# Patient Record
Sex: Male | Born: 1960
Health system: Southern US, Community
[De-identification: ages and names within clinical notes are randomized; demographics above are authoritative.]

## PROBLEM LIST (undated history)

## (undated) DIAGNOSIS — E785 Hyperlipidemia, unspecified: Secondary | ICD-10-CM

## (undated) DIAGNOSIS — N200 Calculus of kidney: Secondary | ICD-10-CM

## (undated) DIAGNOSIS — R001 Bradycardia, unspecified: Secondary | ICD-10-CM

## (undated) DIAGNOSIS — I10 Essential (primary) hypertension: Secondary | ICD-10-CM

## (undated) HISTORY — PX: MENISCUS REPAIR: SHX5179

## (undated) HISTORY — DX: Essential (primary) hypertension: I10

## (undated) HISTORY — PX: COLONOSCOPY: SHX174

## (undated) HISTORY — DX: Hyperlipidemia, unspecified: E78.5

## (undated) MED FILL — ATORVASTATIN CALCIUM 80MG TABS: 80 MG | 30 days supply | Qty: 30 | Fill #0 | Status: AC

## (undated) MED FILL — AMLODIPINE BESYLATE 5MG TABS: 5 MG | 30 days supply | Qty: 30 | Fill #0 | Status: AC

## (undated) MED FILL — FAMOTIDINE 20MG TABS: 20 MG | 30 days supply | Qty: 60 | Fill #0 | Status: AC

## (undated) MED FILL — ASPIRIN LOW DOSE 81MG CHEW: 81 MG | 30 days supply | Qty: 30 | Fill #0 | Status: AC

## (undated) MED FILL — PRASUGREL HCL 10MG TABS: 10 MG | 30 days supply | Qty: 30 | Fill #0 | Status: AC

## (undated) MED FILL — ISOSORBIDE MONONITRATE ER 30 TB24: 30 MG | 60 days supply | Qty: 30 | Fill #0 | Status: AC

## (undated) MED FILL — NITROGLYCERIN 0.4MG SUBL (25CT): 0.4 MG | 30 days supply | Qty: 25 | Fill #0 | Status: AC

---

## 2003-03-08 HISTORY — PX: APPENDECTOMY: SHX54

## 2004-11-24 ENCOUNTER — Encounter: Admission: RE | Admit: 2004-11-24 | Discharge: 2004-11-24 | Payer: Self-pay | Admitting: Internal Medicine

## 2006-10-08 IMAGING — CR DG CHEST SPECIAL VIEW
1 series · 1 of 1 positions shown · non-contrast
Comparison: None.

CLINICAL DATA: Shortness of breath for six weeks with cough.
 CHEST - 2 VIEW ? 11/24/04: 
 CHEST ? SINGLE VIEW ? 11/24/04:

[view not recorded]
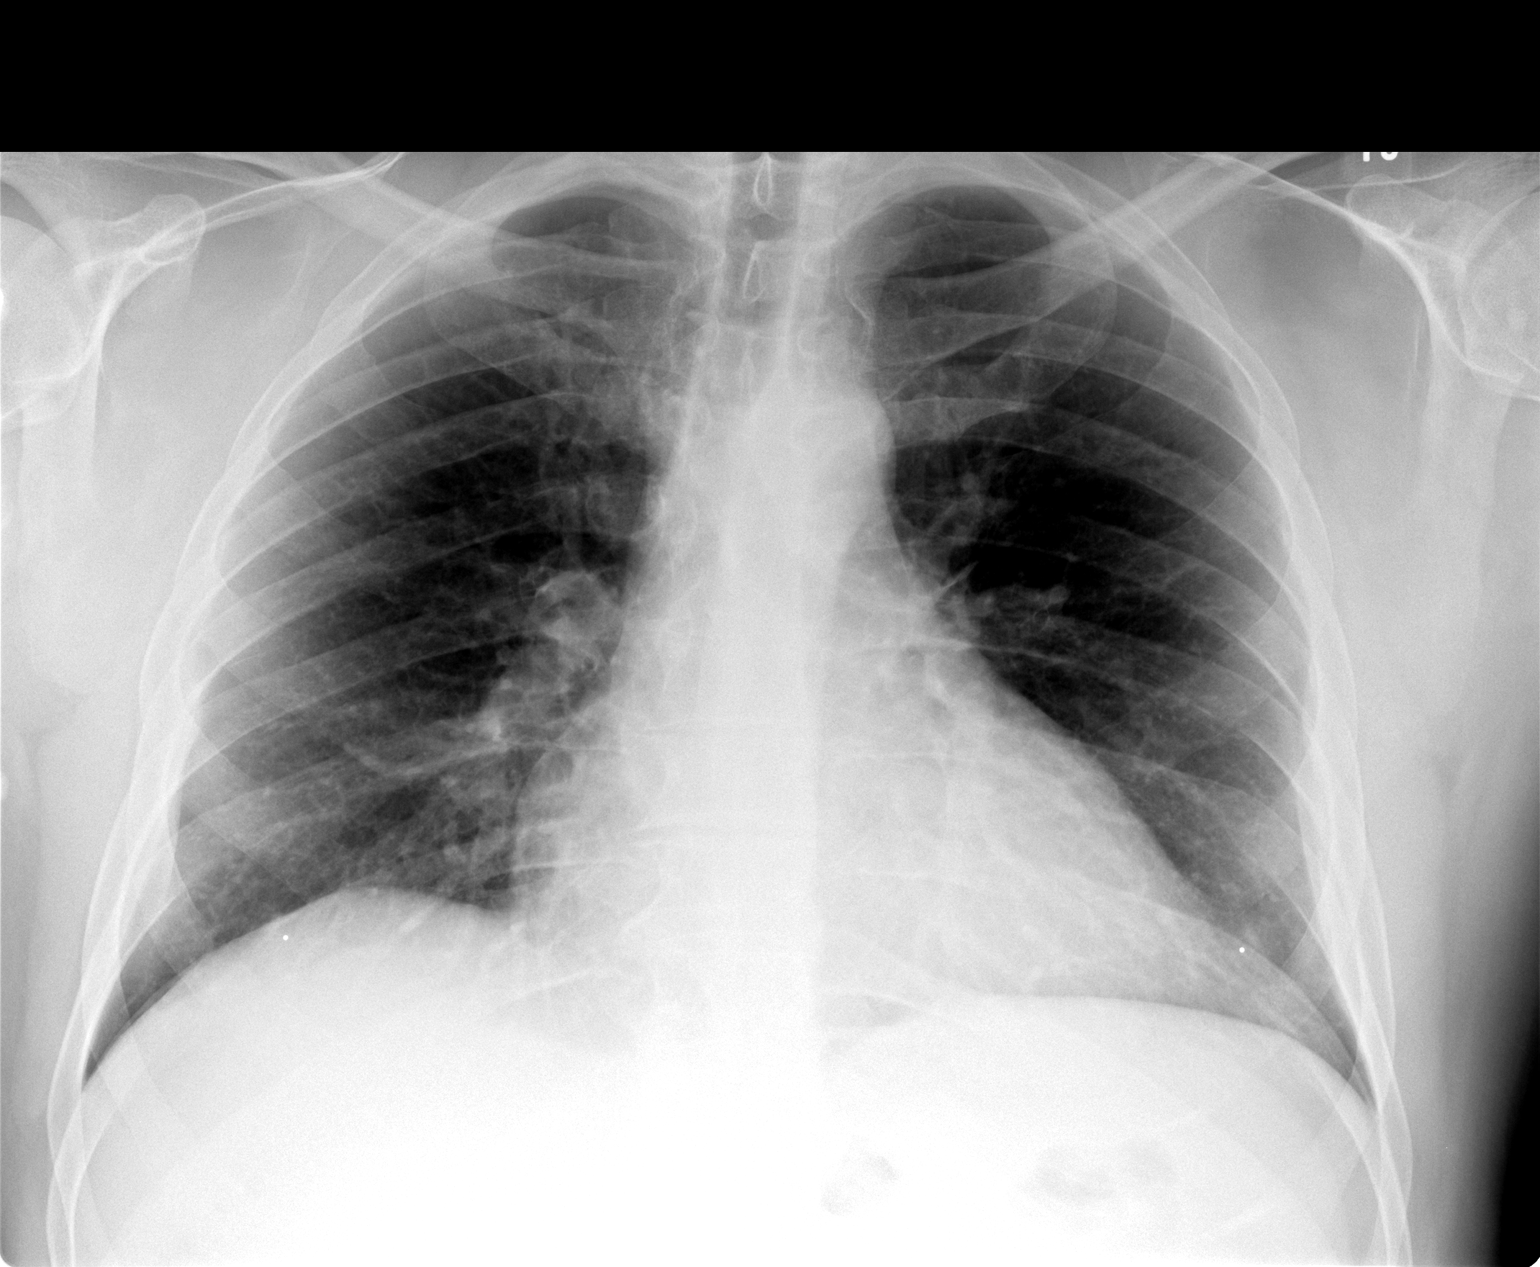

[1 of 1 positions shown; findings below may reference images not displayed]

FINDINGS: Frontal views of the chest with and without nipple markers show the heart size to be upper limits of normal.  The lungs are clear.   Extrapleural fat is seen in the lateral hemithoraces bilaterally.  No pleural fluid.
IMPRESSION: 1.  No acute cardiopulmonary process.
 2.  Heart size upper limits of normal.

## 2006-10-08 IMAGING — CR DG CHEST 2V
2 series · 2 of 2 positions shown · non-contrast
Comparison: None.

CLINICAL DATA: Shortness of breath for six weeks with cough.
 CHEST - 2 VIEW ? 11/24/04: 
 CHEST ? SINGLE VIEW ? 11/24/04:

[view not recorded (1 of 2)]
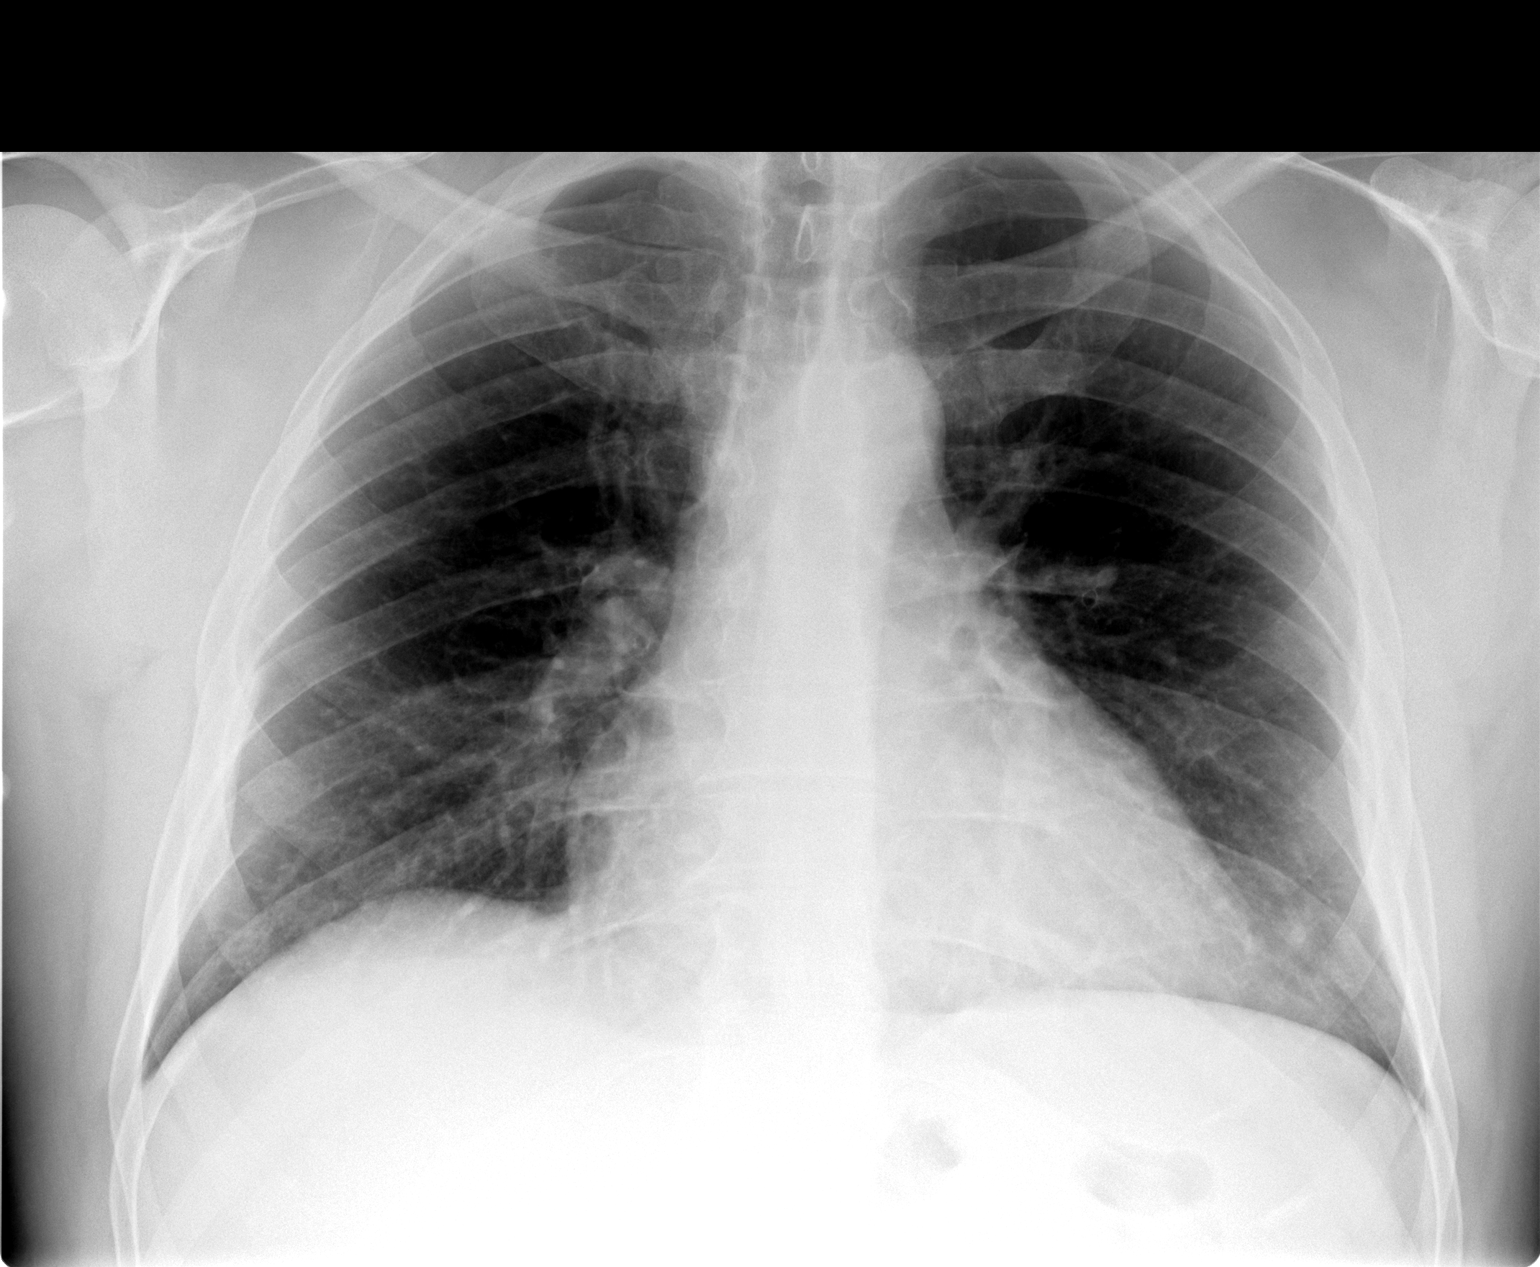

[view not recorded (2 of 2)]
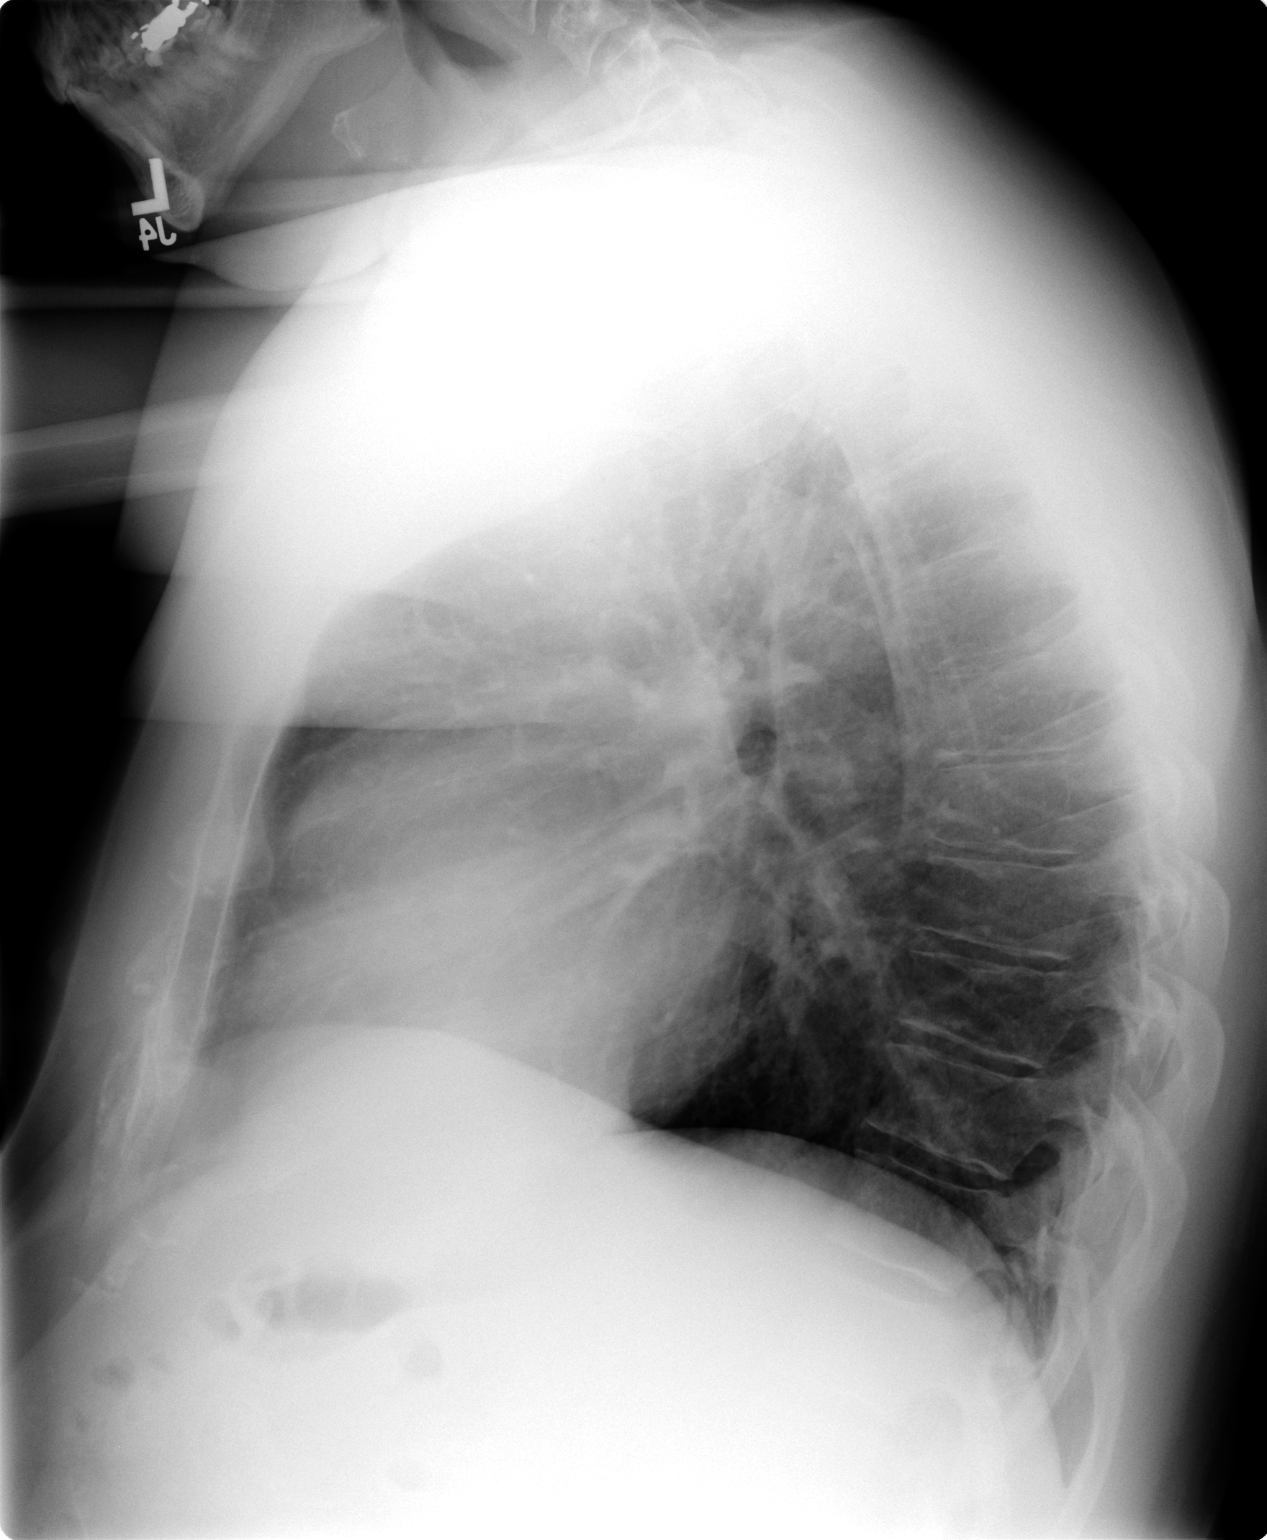

[2 of 2 positions shown; findings below may reference images not displayed]

FINDINGS: Frontal views of the chest with and without nipple markers show the heart size to be upper limits of normal.  The lungs are clear.   Extrapleural fat is seen in the lateral hemithoraces bilaterally.  No pleural fluid.
IMPRESSION: 1.  No acute cardiopulmonary process.
 2.  Heart size upper limits of normal.

## 2011-08-15 ENCOUNTER — Encounter: Payer: Self-pay | Admitting: Gastroenterology

## 2011-09-20 ENCOUNTER — Encounter: Payer: Self-pay | Admitting: Gastroenterology

## 2011-09-20 ENCOUNTER — Ambulatory Visit (AMBULATORY_SURGERY_CENTER): Payer: BC Managed Care – PPO | Admitting: *Deleted

## 2011-09-20 VITALS — Ht 74.0 in | Wt 240.0 lb

## 2011-09-20 DIAGNOSIS — Z1211 Encounter for screening for malignant neoplasm of colon: Secondary | ICD-10-CM

## 2011-09-20 MED ORDER — MOVIPREP 100 G PO SOLR: ORAL | Status: DC

## 2011-10-04 ENCOUNTER — Encounter: Payer: Self-pay | Admitting: Gastroenterology

## 2011-10-04 ENCOUNTER — Ambulatory Visit (AMBULATORY_SURGERY_CENTER): Payer: BC Managed Care – PPO | Admitting: Gastroenterology

## 2011-10-04 VITALS — BP 142/86 | HR 42 | Temp 96.4°F | Resp 19 | Ht 74.0 in | Wt 240.0 lb

## 2011-10-04 DIAGNOSIS — Z1211 Encounter for screening for malignant neoplasm of colon: Secondary | ICD-10-CM

## 2011-10-04 DIAGNOSIS — D126 Benign neoplasm of colon, unspecified: Secondary | ICD-10-CM

## 2011-10-04 MED ORDER — SODIUM CHLORIDE 0.9 % IV SOLN: 500.0000 mL | INTRAVENOUS | Status: DC

## 2011-10-04 NOTE — Patient Instructions (Addendum)
Discharge instructions given with verbal understanding. Handout on polyps given. Resume previous medications. YOU HAD AN ENDOSCOPIC PROCEDURE TODAY AT THE [facility]: Refer to the procedure report that was given to you for any specific questions about what was found during the examination.  If the procedure report does not answer your questions, please call your gastroenterologist to clarify.  If you requested that your care partner not be given the details of your procedure findings, then the procedure report has been included in a sealed envelope for you to review at your convenience later.  YOU SHOULD EXPECT: Some feelings of bloating in the abdomen. Passage of more gas than usual.  Walking can help get rid of the air that was put into your GI tract during the procedure and reduce the bloating. If you had a lower endoscopy (such as a colonoscopy or flexible sigmoidoscopy) you may notice spotting of blood in your stool or on the toilet paper. If you underwent a bowel prep for your procedure, then you may not have a normal bowel movement for a few days.  DIET: Your first meal following the procedure should be a light meal and then it is ok to progress to your normal diet.  A half-sandwich or bowl of soup is an example of a good first meal.  Heavy or fried foods are harder to digest and may make you feel nauseous or bloated.  Likewise meals heavy in dairy and vegetables can cause extra gas to form and this can also increase the bloating.  Drink plenty of fluids but you should avoid alcoholic beverages for 24 hours.  ACTIVITY: Your care partner should take you home directly after the procedure.  You should plan to take it easy, moving slowly for the rest of the day.  You can resume normal activity the day after the procedure however you should NOT DRIVE or use heavy machinery for 24 hours (because of the sedation medicines used during the test).    SYMPTOMS TO REPORT IMMEDIATELY: A  gastroenterologist can be reached at any hour.  During normal business hours, 8:30 AM to 5:00 PM Monday through Friday, call [phone].  After hours and on weekends, please call the GI answering service at [phone] who will take a message and have the physician on call contact you.   Following lower endoscopy (colonoscopy or flexible sigmoidoscopy):  Excessive amounts of blood in the stool  Significant tenderness or worsening of abdominal pains  Swelling of the abdomen that is new, acute  Fever of 100F or higher  FOLLOW UP: If any biopsies were taken you will be contacted by phone or by letter within the next 1-3 weeks.  Call your gastroenterologist if you have not heard about the biopsies in 3 weeks.  Our staff will call the home number listed on your records the next business day following your procedure to check on you and address any questions or concerns that you may have at that time regarding the information given to you following your procedure. This is a courtesy call and so if there is no answer at the home number and we have not heard from you through the emergency physician on call, we will assume that you have returned to your regular daily activities without incident.  SIGNATURES/CONFIDENTIALITY: You and/or your care partner have signed paperwork which will be entered into your electronic medical record.  These signatures attest to the fact that that the information above on your After Visit Summary has   been reviewed and is understood.  Full responsibility of the confidentiality of this discharge information lies with you and/or your care-partner. 

## 2011-10-04 NOTE — Progress Notes (Signed)
The pt told me pre-procedure that his heart rate ran low running 44.  Kay advised Dr. Arlyce Dice of this pre-sedation. Maw  The pt tolerated the colonoscopy very well. Maw

## 2011-10-04 NOTE — Op Note (Signed)
[facility] [address] [place], [place]  [address]  COLONOSCOPY PROCEDURE REPORT  PATIENT:  Brandon Short, Brandon Short  MR#:  [id number] BIRTHDATE:  1960/12/27, 50 yrs. old  GENDER:  male ENDOSCOPIST:  Barbette Hair. Arlyce Dice, MD REF. BY:  Nila Nephew, M.D. PROCEDURE DATE:  10/04/2011 PROCEDURE:  Colonoscopy with snare polypectomy, Colon with cold biopsy polypectomy ASA CLASS:  Class I INDICATIONS:  Routine Risk Screening MEDICATIONS:   MAC sedation, administered by CRNA propofol 350mg IV  DESCRIPTION OF PROCEDURE:   After the risks benefits and alternatives of the procedure were thoroughly explained, informed consent was obtained.  Digital rectal exam was performed and revealed no abnormalities.   The LB [id number]-[id number] endoscope was introduced through the anus and advanced to the cecum, which was identified by both the appendix and ileocecal valve, without limitations.  The quality of the prep was excellent, using MoviPrep.  The instrument was then slowly withdrawn as the colon was fully examined. <<PROCEDUREIMAGES>>  FINDINGS:  Three polyps were found in the sigmoid colon. Single 2mm and 2 2-41mm sessile polyps between 18 and 20cm from anus, removed via cold bx forceps (2mm polyp) and cold polypectomy snare, respectively (see image2 and image3).  This was otherwise a normal examination of the colon (see image1 and image4). Retroflexed views in the rectum revealed no abnormalities.    The time to cecum =  1) 2.0  minutes. The scope was then withdrawn in 1) 10.50  minutes from the cecum and the procedure completed.COMPLICATIONS:  None ENDOSCOPIC IMPRESSION: 1) Three polyps in the sigmoid colon 2) Otherwise normal examination RECOMMENDATIONS: 1) If the polyp(s) removed today are proven to be adenomatous (pre-cancerous) polyps, you will need a repeat colonoscopy in 5 years. Otherwise you should continue to follow colorectal cancer screening guidelines for "routine risk" patients  with colonoscopy in 10 years. You will receive a letter within 1-2 weeks with the results of your biopsy as well as final recommendations. Please call my office if you have not received a letter after 3 weeks. REPEAT EXAM:   You will receive a letter from Dr. Arlyce Dice in 1-2 weeks, after reviewing the final pathology, with followup recommendations.  ______________________________ Barbette Hair Arlyce Dice, MD  CC:  n. eSIGNED:   Barbette Hair. Girl Schissler at 10/04/2011 09:58 AM  Virgel Gess, [id number]

## 2011-10-04 NOTE — Progress Notes (Signed)
Patient did not experience any of the following events: a burn prior to discharge; a fall within the facility; wrong site/side/patient/procedure/implant event; or a hospital transfer or hospital admission upon discharge from the facility. (G8907) Patient did not have preoperative order for IV antibiotic SSI prophylaxis. (G8918)  

## 2011-10-05 ENCOUNTER — Telehealth: Payer: Self-pay | Admitting: *Deleted

## 2011-10-05 NOTE — Telephone Encounter (Signed)
  Follow up Call-  Call back number 10/04/2011  Post procedure Call Back phone  # [phone]  Permission to leave phone message Yes     Patient questions:  Do you have a fever, pain , or abdominal swelling? no Pain Score  0 *  Have you tolerated food without any problems? yes  Have you been able to return to your normal activities? yes  Do you have any questions about your discharge instructions: Diet   no Medications  no Follow up visit  no  Do you have questions or concerns about your Care? no  Actions: * If pain score is 4 or above: No action needed, pain <4.

## 2011-10-10 ENCOUNTER — Encounter: Payer: Self-pay | Admitting: Gastroenterology

## 2017-06-27 DIAGNOSIS — H2513 Age-related nuclear cataract, bilateral: Secondary | ICD-10-CM | POA: Insufficient documentation

## 2017-06-27 DIAGNOSIS — H209 Unspecified iridocyclitis: Secondary | ICD-10-CM | POA: Insufficient documentation

## 2017-06-27 DIAGNOSIS — H35411 Lattice degeneration of retina, right eye: Secondary | ICD-10-CM | POA: Insufficient documentation

## 2018-03-20 ENCOUNTER — Other Ambulatory Visit: Payer: Self-pay | Admitting: Internal Medicine

## 2018-03-20 DIAGNOSIS — R112 Nausea with vomiting, unspecified: Secondary | ICD-10-CM

## 2018-03-20 DIAGNOSIS — R109 Unspecified abdominal pain: Secondary | ICD-10-CM

## 2018-03-22 ENCOUNTER — Other Ambulatory Visit: Payer: Self-pay

## 2018-03-22 ENCOUNTER — Ambulatory Visit
Admission: RE | Admit: 2018-03-22 | Discharge: 2018-03-22 | Disposition: A | Discharge status: Home / Self Care | Payer: 59 | Source: Ambulatory Visit | Attending: Internal Medicine | Admitting: Internal Medicine

## 2018-03-22 DIAGNOSIS — R109 Unspecified abdominal pain: Secondary | ICD-10-CM

## 2018-03-22 DIAGNOSIS — R112 Nausea with vomiting, unspecified: Secondary | ICD-10-CM

## 2019-02-08 ENCOUNTER — Other Ambulatory Visit: Payer: Self-pay

## 2019-02-08 DIAGNOSIS — Z20822 Contact with and (suspected) exposure to covid-19: Secondary | ICD-10-CM

## 2019-02-10 LAB — NOVEL CORONAVIRUS, NAA: SARS-CoV-2, NAA: NOT DETECTED

## 2019-09-27 DIAGNOSIS — M1990 Unspecified osteoarthritis, unspecified site: Secondary | ICD-10-CM | POA: Insufficient documentation

## 2019-12-06 ENCOUNTER — Encounter: Payer: Self-pay | Admitting: Internal Medicine

## 2019-12-06 ENCOUNTER — Other Ambulatory Visit: Payer: Self-pay

## 2019-12-06 ENCOUNTER — Ambulatory Visit (INDEPENDENT_AMBULATORY_CARE_PROVIDER_SITE_OTHER): Payer: 59 | Admitting: Internal Medicine

## 2019-12-06 DIAGNOSIS — R001 Bradycardia, unspecified: Secondary | ICD-10-CM | POA: Diagnosis not present

## 2019-12-06 NOTE — Progress Notes (Signed)
HPI Brandon Short is referred by Dr. Francesco Sor for evaluation of sinus bradycardia. He is a pleasant healthy 59 yo man with arthritis. He exercises regularly and lost about 30 lbs. Several years ago. He has always had a slow heart beat and denies syncope. He has no chest pain or sob or edema.  No Known Allergies   No current outpatient medications on file.   No current facility-administered medications for this visit.     History reviewed. No pertinent past medical history.  ROS:   All systems reviewed and negative except as noted in the HPI.   Past Surgical History:  Procedure Laterality Date  . APPENDECTOMY  2005     Family History  Problem Relation Age of Onset  . Prostate cancer Father   . Crohn's disease Sister      Social History   Socioeconomic History  . Marital status: Single    Spouse name: Not on file  . Number of children: Not on file  . Years of education: Not on file  . Highest education level: Not on file  Occupational History  . Not on file  Tobacco Use  . Smoking status: Never Smoker  . Smokeless tobacco: Never Used  Substance and Sexual Activity  . Alcohol use: No  . Drug use: No  . Sexual activity: Not on file  Other Topics Concern  . Not on file  Social History Narrative  . Not on file   Social Determinants of Health   Financial Resource Strain:   . Difficulty of Paying Living Expenses: Not on file  Food Insecurity:   . Worried About Charity fundraiser in the Last Year: Not on file  . Ran Out of Food in the Last Year: Not on file  Transportation Needs:   . Lack of Transportation (Medical): Not on file  . Lack of Transportation (Non-Medical): Not on file  Physical Activity:   . Days of Exercise per Week: Not on file  . Minutes of Exercise per Session: Not on file  Stress:   . Feeling of Stress : Not on file  Social Connections:   . Frequency of Communication with Friends and Family: Not on file  . Frequency of Social  Gatherings with Friends and Family: Not on file  . Attends Religious Services: Not on file  . Active Member of Clubs or Organizations: Not on file  . Attends Archivist Meetings: Not on file  . Marital Status: Not on file  Intimate Partner Violence:   . Fear of Current or Ex-Partner: Not on file  . Emotionally Abused: Not on file  . Physically Abused: Not on file  . Sexually Abused: Not on file     BP (!) 152/80   Pulse (!) 29   Ht 6\' 2"  (1.88 m)   Wt 207 lb (93.9 kg)   SpO2 97%   BMI 26.58 kg/m   Physical Exam:  Well appearing NAD HEENT: Unremarkable Neck:  No JVD, no thyromegally Lymphatics:  No adenopathy Back:  No CVA tenderness Lungs:  Clear with no wheezes HEART:  Regular brady rhythm, no murmurs, no rubs, no clicks Abd:  soft, positive bowel sounds, no organomegally, no rebound, no guarding Ext:  2 plus pulses, no edema, no cyanosis, no clubbing Skin:  No rashes no nodules Neuro:  CN II through XII intact, motor grossly intact  EKG - marked sinus bradycardia    Assess/Plan: 1. Sinus node dysfunction - he is  asymptomatic. We discussed the symptoms he might experience when his HR gets so slow he feels poorly as well as symptoms he would experience if he has prolonged pauses. He will undergo watchful waiting. In addition I mentioned that he was at risk for developing atrial fib though he does not have any evidence of this so far.   Brandon Overlie Ebonie Westerlund,MD

## 2019-12-06 NOTE — Patient Instructions (Addendum)
Medication Instructions:  Your physician recommends that you continue on your current medications as directed. Please refer to the Current Medication list given to you today.  Labwork: None ordered.  Testing/Procedures: None ordered.  Follow-Up: Your physician wants you to follow-up in: as needed with Dr. Taylor.      Any Other Special Instructions Will Be Listed Below (If Applicable).  If you need a refill on your cardiac medications before your next appointment, please call your pharmacy.   

## 2020-12-08 DIAGNOSIS — E785 Hyperlipidemia, unspecified: Secondary | ICD-10-CM | POA: Diagnosis not present

## 2020-12-08 DIAGNOSIS — Z125 Encounter for screening for malignant neoplasm of prostate: Secondary | ICD-10-CM | POA: Diagnosis not present

## 2020-12-17 DIAGNOSIS — R7301 Impaired fasting glucose: Secondary | ICD-10-CM | POA: Diagnosis not present

## 2020-12-17 DIAGNOSIS — L989 Disorder of the skin and subcutaneous tissue, unspecified: Secondary | ICD-10-CM | POA: Diagnosis not present

## 2020-12-17 DIAGNOSIS — Z1331 Encounter for screening for depression: Secondary | ICD-10-CM | POA: Diagnosis not present

## 2020-12-17 DIAGNOSIS — R001 Bradycardia, unspecified: Secondary | ICD-10-CM | POA: Diagnosis not present

## 2020-12-17 DIAGNOSIS — Z0000 Encounter for general adult medical examination without abnormal findings: Secondary | ICD-10-CM | POA: Diagnosis not present

## 2020-12-17 DIAGNOSIS — E785 Hyperlipidemia, unspecified: Secondary | ICD-10-CM | POA: Diagnosis not present

## 2021-01-16 DIAGNOSIS — I219 Acute myocardial infarction, unspecified: Secondary | ICD-10-CM

## 2021-01-16 HISTORY — DX: Acute myocardial infarction, unspecified: I21.9

## 2021-01-17 ENCOUNTER — Emergency Department: Admit: 2021-01-18 | Payer: BLUE CROSS/BLUE SHIELD

## 2021-01-17 ENCOUNTER — Inpatient Hospital Stay
Admit: 2021-01-17 | Discharge: 2021-01-20 | Disposition: A | Discharge status: Home / Self Care | Payer: BLUE CROSS/BLUE SHIELD | Admitting: Internal Medicine

## 2021-01-17 ENCOUNTER — Emergency Department: Admit: 2021-01-17 | Payer: BLUE CROSS/BLUE SHIELD

## 2021-01-17 ENCOUNTER — Encounter

## 2021-01-17 ENCOUNTER — Inpatient Hospital Stay
Admit: 2021-01-17 | Discharge: 2021-01-17 | Disposition: A | Discharge status: Other Acute Inpatient Hospital | Payer: BLUE CROSS/BLUE SHIELD | Attending: Emergency Medicine

## 2021-01-17 DIAGNOSIS — I213 ST elevation (STEMI) myocardial infarction of unspecified site: Secondary | ICD-10-CM

## 2021-01-17 DIAGNOSIS — I2109 ST elevation (STEMI) myocardial infarction involving other coronary artery of anterior wall: Secondary | ICD-10-CM

## 2021-01-17 DIAGNOSIS — I214 Non-ST elevation (NSTEMI) myocardial infarction: Secondary | ICD-10-CM | POA: Insufficient documentation

## 2021-01-17 LAB — CBC WITH AUTO DIFFERENTIAL
Absolute Eos #: 0.1 10*3/uL (ref 0.0–0.4)
Absolute Lymph #: 2.6 10*3/uL (ref 1.0–4.8)
Absolute Mono #: 0.8 10*3/uL (ref 0.1–1.3)
Basophils Absolute: 0 10*3/uL (ref 0.0–0.2)
Basophils: 0 % (ref 0–2)
Eosinophils %: 1 % (ref 0–4)
Hematocrit: 43.3 % (ref 41–53)
Hemoglobin: 14.6 g/dL (ref 13.5–17.5)
Lymphocytes: 31 % (ref 24–44)
MCH: 29.3 pg (ref 26–34)
MCHC: 33.8 g/dL (ref 31–37)
MCV: 86.6 fL (ref 80–100)
MPV: 8.7 fL (ref 6.0–12.0)
Monocytes: 10 % — ABNORMAL HIGH (ref 1–7)
Platelets: 214 10*3/uL (ref 150–450)
RBC: 5 m/uL (ref 4.5–5.9)
RDW: 13 % (ref 11.5–14.9)
Seg Neutrophils: 58 % (ref 36–66)
Segs Absolute: 5 10*3/uL (ref 1.3–9.1)
WBC: 8.6 10*3/uL (ref 3.5–11.0)

## 2021-01-17 LAB — BASIC METABOLIC PANEL W/ REFLEX TO MG FOR LOW K
Anion Gap: 9 mmol/L (ref 9–17)
BUN: 21 mg/dL — ABNORMAL HIGH (ref 6–20)
CO2: 25 mmol/L (ref 20–31)
Calcium: 8.9 mg/dL (ref 8.6–10.4)
Chloride: 107 mmol/L (ref 98–107)
Creatinine: 1.07 mg/dL (ref 0.70–1.20)
Est, Glom Filt Rate: >60 mL/min/{1.73_m2} (ref >60)
Glucose: 85 mg/dL (ref 70–99)
Potassium: 4 mmol/L (ref 3.7–5.3)
Sodium: 141 mmol/L (ref 135–144)

## 2021-01-17 LAB — MYOGLOBIN, BLOOD: Myoglobin: 140 ng/mL — ABNORMAL HIGH (ref 28–72)

## 2021-01-17 LAB — D-DIMER, QUANTITATIVE: D-Dimer, Quant: 0.31 mg/L FEU (ref 0.00–0.59)

## 2021-01-17 LAB — BRAIN NATRIURETIC PEPTIDE: Pro-BNP: 184 pg/mL (ref <300)

## 2021-01-17 LAB — TROPONIN: Troponin, High Sensitivity: 1097 ng/L (ref 0–22)

## 2021-01-17 LAB — CK: Total CK: 1192 U/L — ABNORMAL HIGH (ref 39–308)

## 2021-01-17 MED ORDER — NITROGLYCERIN IN D5W 200-5 MCG/ML-% IV SOLN
200-5- MCG/ML-% | INTRAVENOUS | Status: DC
Start: 2021-01-17 — End: 2021-01-17
  Administered 2021-01-17: 23:00:00 5 ug/min via INTRAVENOUS

## 2021-01-17 MED ORDER — ASPIRIN 81 MG PO CHEW
81 MG | Freq: Once | ORAL | Status: AC
Start: 2021-01-17 — End: 2021-01-17
  Administered 2021-01-17: 23:00:00 324 mg via ORAL

## 2021-01-17 MED ORDER — NITROGLYCERIN 0.4 MG SL SUBL
0.4 MG | SUBLINGUAL | Status: DC | PRN
Start: 2021-01-17 — End: 2021-01-17
  Administered 2021-01-17: 23:00:00 0.4 mg via SUBLINGUAL

## 2021-01-17 MED ORDER — HEPARIN SODIUM (PORCINE) 1000 UNIT/ML IJ SOLN
1000 UNIT/ML | Freq: Once | INTRAMUSCULAR | Status: AC
Start: 2021-01-17 — End: 2021-01-17
  Administered 2021-01-17: 23:00:00 4000 [IU] via INTRAVENOUS

## 2021-01-17 MED FILL — NITROGLYCERIN 0.4 MG SL SUBL: 0.4 MG | SUBLINGUAL | Qty: 25

## 2021-01-17 MED FILL — NITROGLYCERIN IN D5W 200-5 MCG/ML-% IV SOLN: 200-5 MCG/ML-% | INTRAVENOUS | Qty: 250

## 2021-01-17 MED FILL — HEPARIN SODIUM (PORCINE) 1000 UNIT/ML IJ SOLN: 1000 UNIT/ML | INTRAMUSCULAR | Qty: 10

## 2021-01-17 MED FILL — CHILDRENS ASPIRIN 81 MG PO CHEW: 81 MG | ORAL | Qty: 4

## 2021-01-17 NOTE — ED Provider Notes (Signed)
[facility] [facility] ED  Emergency Department Encounter  EmergencyMedicine Resident     Pt Name:Tyler Arroyo  MRN: [id number]  Birthdate [birth year]  Date of evaluation: 01/17/21  PCP:  No primary care provider on file.    CHIEF COMPLAINT       Chief Complaint   Patient presents with    Hypertension    Shortness of Breath    Dental Pain    Chest Pain       HISTORY OF PRESENT ILLNESS  (Location/Symptom, Timing/Onset, Context/Setting, Quality, Duration, Modifying Factors, Severity.)      Tyler Arroyo is a 60 y.o. male who presents with teeth pain and high blood pressure.  Patient initially presented to urgent care and was sent to the emergency room.  Per family member patient had just run a half marathon yesterday, after the race he had intense teeth pain and some chest tightness.  She also states that he looked pale and did not look himself.  Patient states that the pain persisted, he thought he had some sort of dental infection.  She has no past medical history, is not a smoker.    PAST MEDICAL / SURGICAL / SOCIAL / FAMILY HISTORY      has no past medical history on file.  No past medical history on review     has no past surgical history on file.  No past surgical history on review    Social History     Socioeconomic History    Marital status: Not on file     Spouse name: Not on file    Number of children: Not on file    Years of education: Not on file    Highest education level: Not on file   Occupational History    Not on file   Tobacco Use    Smoking status: Not on file    Smokeless tobacco: Not on file   Substance and Sexual Activity    Alcohol use: Not on file    Drug use: Not on file    Sexual activity: Not on file   Other Topics Concern    Not on file   Social History Narrative    Not on file     Social Determinants of Health     Financial Resource Strain: Not on file   Food Insecurity: Not on file   Transportation Needs: Not on file   Physical Activity: Not on file   Stress: Not on file   Social  Connections: Not on file   Intimate Partner Violence: Not on file   Housing Stability: Not on file       No family history on file.    Allergies:  Patient has no known allergies.    Home Medications:  Prior to Admission medications    Not on File       REVIEW OF SYSTEMS    (2-9 systems for level 4, 10 or more for level 5)      Review of Systems   Constitutional:  Positive for diaphoresis. Negative for activity change, appetite change, chills and fever.   Respiratory:  Positive for chest tightness and shortness of breath. Negative for cough.    Cardiovascular:  Negative for chest pain.   Gastrointestinal:  Negative for abdominal distention, abdominal pain, nausea and vomiting.   Musculoskeletal:  Negative for back pain and neck pain.   Skin:  Positive for pallor. Negative for rash.   Neurological:  Negative for dizziness, seizures,  syncope, weakness, light-headedness and headaches.     PHYSICAL EXAM   (up to 7 for level 4, 8 or more for level 5)      INITIAL VITALS:   BP (!) 195/99    Pulse (!) 39    Temp 97.7 ??F (36.5 ??C) (Oral)    Resp 17    Ht 6\' 2"  (1.88 m)    Wt 220 lb (99.8 kg)    SpO2 97%    BMI 28.25 kg/m??     Physical Exam  Constitutional:       General: He is awake.      Appearance: Normal appearance.   HENT:      Head: Normocephalic and atraumatic.      Right Ear: External ear normal.      Left Ear: External ear normal.      Nose: Nose normal.   Eyes:      General: Lids are normal.      Extraocular Movements: Extraocular movements intact.   Cardiovascular:      Rate and Rhythm: Bradycardia present.      Pulses: Normal pulses.      Heart sounds: Normal heart sounds.   Pulmonary:      Effort: Pulmonary effort is normal.      Breath sounds: Normal breath sounds.   Musculoskeletal:         General: Normal range of motion.      Cervical back: Normal range of motion.      Comments: Normal range of motion, strength and sensation intact in all extremities.   Neurological:      Mental Status: He is alert and  oriented to person, place, and time.      Sensory: Sensation is intact.      Motor: Motor function is intact.   Psychiatric:         Mood and Affect: Mood normal.         Behavior: Behavior is cooperative.       DIFFERENTIAL  DIAGNOSIS     PLAN (LABS / IMAGING / EKG):  Orders Placed This Encounter   Procedures    XR CHEST PORTABLE    CBC with Auto Differential    Basic Metabolic Panel w/ Reflex to MG    Brain Natriuretic Peptide    D-Dimer, Quantitative    CK    Myoglobin, Blood    Troponin    EKG 12 Lead       MEDICATIONS ORDERED:  Orders Placed This Encounter   Medications    aspirin chewable tablet 324 mg    DISCONTD: nitroGLYCERIN (NITROSTAT) SL tablet 0.4 mg    heparin (porcine) injection 4,000 Units    DISCONTD: nitroGLYCERIN 50 mg in dextrose 5% 250 mL infusion     Order Specific Question:   Titrate Infusion?     Answer:   Yes     Order Specific Question:   Initial Infusion Dose:     Answer:   5 mcg/min     Order Specific Question:   Goal of Therapy is:     Answer:   SBP less than 160 mmHg     Order Specific Question:   Contact Provider if:     Answer:   SBP less than 90 mmHg       DDX: STEMI    MDM: 60 y.o. male presents today with dental pain, high blood pressure and chest tightness.  EKG on arrival is concerning for STEMI, oncology activated and cardiology  spoken to.  Patient is given aspirin and sublingual nitro as ordered.  Per cardiology we will start patient on heparin bolus and nitro drip.  We will transfer patient to [facility] for cardiology evaluation.  Initial troponin 1097 CK elevated at 1192    EMERGENCY DEPARTMENT COURSE:  ED Course as of 01/18/21 0150   Sun Jan 17, 2021   1811 STEMI alert called, auto launch to [facility] [SS]   1815 states that his heart rate is baseline in the upper 30s. [SS]   1818 Spoke to Dr. Orson Aloe, aware of patient and will notify cardiology on arrival  [SS]   1818 Start on heparin bolus and nitro gtt, per cardiology  [SS]   1838 Troponin, High Sensitivity(!!):  1,097 [SS]   1842 Total CK(!): 1,192 [SS]      ED Course User Index  [SS] Achilles Dunk, MD            Glasgow Coma Scale  Eye Opening: Spontaneous  Best Verbal Response: Oriented  Best Motor Response: Obeys commands  Glasgow Coma Scale Score: 15  DIAGNOSTIC RESULTS / EMERGENCY DEPARTMENT COURSE / MDM   LAB RESULTS:  Results for orders placed or performed during the hospital encounter of 01/17/21   CBC with Auto Differential   Result Value Ref Range    WBC 8.6 3.5 - 11.0 k/uL    RBC 5.00 4.5 - 5.9 m/uL    Hemoglobin 14.6 13.5 - 17.5 g/dL    Hematocrit 47.4 41 - 53 %    MCV 86.6 80 - 100 fL    MCH 29.3 26 - 34 pg    MCHC 33.8 31 - 37 g/dL    RDW 25.9 56.3 - 87.5 %    Platelets 214 150 - 450 k/uL    MPV 8.7 6.0 - 12.0 fL    Seg Neutrophils 58 36 - 66 %    Lymphocytes 31 24 - 44 %    Monocytes 10 (H) 1 - 7 %    Eosinophils % 1 0 - 4 %    Basophils 0 0 - 2 %    Segs Absolute 5.00 1.3 - 9.1 k/uL    Absolute Lymph # 2.60 1.0 - 4.8 k/uL    Absolute Mono # 0.80 0.1 - 1.3 k/uL    Absolute Eos # 0.10 0.0 - 0.4 k/uL    Basophils Absolute 0.00 0.0 - 0.2 k/uL   Basic Metabolic Panel w/ Reflex to MG   Result Value Ref Range    Glucose 85 70 - 99 mg/dL    BUN 21 (H) 6 - 20 mg/dL    Creatinine 6.43 3.29 - 1.20 mg/dL    Est, Glom Filt Rate >60 >60 mL/min/1.45m2    Calcium 8.9 8.6 - 10.4 mg/dL    Sodium 518 841 - 660 mmol/L    Potassium 4.0 3.7 - 5.3 mmol/L    Chloride 107 98 - 107 mmol/L    CO2 25 20 - 31 mmol/L    Anion Gap 9 9 - 17 mmol/L   Brain Natriuretic Peptide   Result Value Ref Range    Pro-BNP 184 <300 pg/mL   D-Dimer, Quantitative   Result Value Ref Range    D-Dimer, Quant 0.31 0.00 - 0.59 mg/L FEU   CK   Result Value Ref Range    Total CK 1,192 (H) 39 - 308 U/L   Myoglobin, Blood   Result Value Ref Range    Myoglobin 140 (H)  28 - 72 ng/mL   Troponin   Result Value Ref Range    Troponin, High Sensitivity 1,097 (HH) 0 - 22 ng/L           RADIOLOGY:  XR CHEST PORTABLE   Final Result   1.  No acute abnormality.               EKG  EKG Interpretation    Interpreted by emergency department physician    Rhythm: normal sinus   Rate: bradycardia  Axis: left  Ectopy: none  Conduction: Prolonged PR   ST Segments:  elevation in  v1, v2, and v3  T Waves: non specific changes      Clinical Impression: Concerning for STEMI    Rosilyn Mings Naysa Puskas, MD     All EKG's are interpreted by the Emergency Department Physician who either signs or Co-signs this chart in the absence of a cardiologist.        PROCEDURES:  None    CONSULTS:  None    CRITICAL CARE:  None    FINAL IMPRESSION      1. ST elevation myocardial infarction (STEMI), unspecified artery (HCC)          DISPOSITION / PLAN     DISPOSITION Decision To Transfer 01/17/2021 06:20:38 PM      PATIENT REFERRED TO:  No follow-up provider specified.    DISCHARGE MEDICATIONS:  There are no discharge medications for this patient.      Rosilyn Mings Marshal Eskew, MD  Emergency Medicine Resident    (Please note that portions of thisnote were completed with a voice recognition program.  Efforts were made to edit the dictations but occasionally words are mis-transcribed.)        Achilles Dunk, MD  Resident  01/18/21 [phone]

## 2021-01-17 NOTE — ED Provider Notes (Signed)
[facility] ED  Emergency Department Encounter  EmergencyMedicine Resident     Pt Name:Tyler Arroyo  MRN: [id number]  Birthdate 1960-04-18  Date of evaluation: 01/17/21  PCP:  No primary care provider on file.    This patient was evaluated in the Emergency Department for symptoms described in the history of present illness.     CHIEF COMPLAINT       Chief Complaint   Patient presents with    Chest Pain       HISTORY OF PRESENT ILLNESS  (Location/Symptom, Timing/Onset, Context/Setting, Quality, Duration, Modifying Factors, Severity.)      CARLOS HEBER is a 60 y.o. male who presents as a transfer for concerns for STEMI.  Patient presented to outside facility after experiencing roughly 12 hours of chest pain and chest pressure.  Patient states that yesterday he ran a half marathon.  Then he got into a car and did an 8-hour car ride from [place] to the city.  Patient states that after his half marathon he started experiencing chest pain and chest pressure.  Throughout the day today patient has also been experiencing the symptoms.  Presented to outside facility.  At outside facility troponin was found to be roughly 1000 with an EKG with ST elevations in the lateral leads.  Patient was asymptomatic at that time.  Given aspirin, heparin bolus, started on nitro drip.  Patient transferred to this facility.  Upon initial evaluation patient resting comfortably, no acute distress, no complaints at this time.  On nitro drip.  Denies chest pain, chest pressure, shortness of breath, nausea, diaphoresis, back pain, abdominal pain.    PAST MEDICAL / SURGICAL / SOCIAL / FAMILY HISTORY      has no past medical history on file.  Denies     has no past surgical history on file.  Denies    Social History     Socioeconomic History    Marital status: Not on file     Spouse name: Not on file    Number of children: Not on file    Years of education: Not on file    Highest education level: Not on file    Occupational History    Not on file   Tobacco Use    Smoking status: Not on file    Smokeless tobacco: Not on file   Substance and Sexual Activity    Alcohol use: Not on file    Drug use: Not on file    Sexual activity: Not on file   Other Topics Concern    Not on file   Social History Narrative    Not on file     Social Determinants of Health     Financial Resource Strain: Not on file   Food Insecurity: Not on file   Transportation Needs: Not on file   Physical Activity: Not on file   Stress: Not on file   Social Connections: Not on file   Intimate Partner Violence: Not on file   Housing Stability: Not on file       History reviewed. No pertinent family history.    Allergies:    Patient has no known allergies.    Home Medications:  Prior to Admission medications    Not on File       REVIEW OF SYSTEMS    (2-9 systems for level 4, 10 or more for level 5)    Review of Systems   Constitutional:  Negative for  chills and fever.   HENT:  Negative for congestion.    Eyes:  Negative for visual disturbance.   Respiratory:  Negative for shortness of breath.    Cardiovascular:  Negative for chest pain.   Gastrointestinal:  Negative for abdominal pain, constipation, diarrhea, nausea and vomiting.   Genitourinary:  Negative for difficulty urinating and dysuria.   Musculoskeletal:  Negative for back pain.   Skin:  Negative for wound.   Neurological:  Negative for weakness, numbness and headaches.       PHYSICAL EXAM   (up to 7 for level 4, 8 or more for level 5)    INITIAL VITALS:   BP (!) 178/97    Pulse (!) 42    Temp 98 ??F (36.7 ??C) (Oral)    Resp 15    Ht 6\' 2"  (1.88 m)    Wt 220 lb (99.8 kg)    SpO2 98%    BMI 28.25 kg/m??   I have reviewed the triage vital signs.  Const: Well nourished, well developed, appears stated age, no acute distress  Eyes: PERRL, no conjunctival injection  HENT: NCAT, Neck supple without meningismus   CV: RRR, Warm, well-perfused extremities.  +2/4 DP and radial pulses bilaterally.  No leg  swelling.  RESP: CTAB, Unlabored respiratory effort  GI: soft, non-tender, non-distended, no masses  MSK: No gross deformities appreciated  Skin: Warm, dry. No rashes  Neuro: Alert, CNs II-XII grossly intact. Sensation and motor function of extremities grossly intact.  Psych: Appropriate mood and affect.      DIFFERENTIAL  DIAGNOSIS   DDX:  ACS/MI    Initial MDM:  60 year old male transferred from outside facility with concerns for STEMI.  Troponin around 1000, EKG showing ischemic changes in lateral leads.  Patient bradycardic in the mid 30s.  Patient states that he does have a history of bradycardia but not this low.  Blood pressure stable.  On nitro drip.  Received aspirin and heparin at outside facility.  Physical exam unremarkable.  EKG obtained and sent to interventional cardiologist on 1901.  Dr. 1902, interventional cardiologist, did not recommend cath at this time due to patient being asymptomatic.  Wanted to be informed if patient became symptomatic.  We will start heparin infusion.  We will get lab work.  Will get chest x-ray.  Will admit patient.    STEMI alert  1. Time of EKG: 1901  2. Time EKG seen by attending physician: 1901  3. Time STEMI Alert called: 1950  4. Time Cardiologist paged: 1906  5. Time Cardiologist return call: 1910  6. Name of Cardiologist: Dr. Thedore Mins  7. Any changes to STEMI Alert by cardiologist: Cardiologist asked for STEMI alert at 1950      PLAN (LABS / IMAGING / EKG):  Orders Placed This Encounter   Procedures    XR CHEST PORTABLE    Troponin    CBC    CBC    APTT    Activated clotting time    Inpatient consult to Critical Care    Inpatient consult to Hospitalist    ADMIT TO INPATIENT       MEDICATIONS ORDERED:  Orders Placed This Encounter   Medications    heparin 25,000 units in dextrose 5 % 250 mL infusion (rate based)    heparin (porcine) injection 4,000 Units    heparin (porcine) injection 2,000 Units    nitroGLYCERIN 50 mg in dextrose 5% 250 mL infusion     Order Specific  Question:  Titrate Infusion?     Answer:   Yes     Order Specific Question:   Initial Infusion Dose:     Answer:   20 mcg/min     Order Specific Question:   Goal of Therapy is:     Answer:   Chest pain symptom relief     Order Specific Question:   Contact Provider if:     Answer:   SBP less than 90 mmHg         DIAGNOSTIC RESULTS / EMERGENCY DEPARTMENT COURSE / MDM   LAB RESULTS:  Results for orders placed or performed during the hospital encounter of 01/17/21   Troponin   Result Value Ref Range    Troponin, High Sensitivity 1,106 (HH) 0 - 22 ng/L   CBC   Result Value Ref Range    WBC 8.2 3.5 - 11.3 k/uL    RBC 4.72 4.21 - 5.77 m/uL    Hemoglobin 13.8 13.0 - 17.0 g/dL    Hematocrit 24.4 01.0 - 50.3 %    MCV 88.1 82.6 - 102.9 fL    MCH 29.2 25.2 - 33.5 pg    MCHC 33.2 28.4 - 34.8 g/dL    RDW 27.2 53.6 - 64.4 %    Platelets 210 138 - 453 k/uL    MPV 10.7 8.1 - 13.5 fL    NRBC Automated 0.0 0.0 per 100 WBC   APTT   Result Value Ref Range    PTT 39.4 (H) 20.5 - 30.5 sec   Activated clotting time   Result Value Ref Range    Activated Clotting Time 233 (H) 79 - 149 sec       Elevated troponin of 1106, increased from troponin obtained at outside facility of 1097.  PTT elevated due to heparin.    RADIOLOGY:  XR CHEST PORTABLE    Result Date: 01/17/2021  EXAMINATION: ONE XRAY VIEW OF THE CHEST 01/17/2021 7:12 pm COMPARISON: 01/17/2021 HISTORY: ORDERING SYSTEM PROVIDED HISTORY: chest pain TECHNOLOGIST PROVIDED HISTORY: chest pain FINDINGS: The lungs are clear.  The cardiac silhouette is stable.  There is no pneumothorax or pleural effusion.     1. No significant change.     XR CHEST PORTABLE    Result Date: 01/17/2021  EXAMINATION: ONE XRAY VIEW OF THE CHEST 01/17/2021 6:10 pm COMPARISON: None available. HISTORY: ORDERING SYSTEM PROVIDED HISTORY: chest pain TECHNOLOGIST PROVIDED HISTORY: chest pain Reason for Exam: chest pain FINDINGS: The lungs are clear.  The cardiac silhouette is top-normal in size.  There is no  pneumothorax or pleural effusion.     1.  No acute abnormality.      EKG  Rhythm: normal sinus   Rate: bradycardia  Axis: left  Ectopy: premature junctional contraction  Conduction: normal  ST Segments: elevation in  v1, v2, and v3  T Waves: inversion in  v1, v2, v3, and III  Q Waves: III and aVf    EKG  Impression: myocardial infarction  anterior and lateral     All EKG's are interpreted by the Emergency Department Physician who either signs or Co-signs this chart in the absence of a cardiologist.    CONSULTS:  IP CONSULT TO CRITICAL CARE  IP CONSULT TO HOSPITALIST  IP CONSULT TO CARDIOLOGY    MDM/EMERGENCY DEPARTMENT COURSE:  Patient started to develop symptoms around 1946.  Dr. Thedore Mins contacted.  Dr. Thedore Mins requested activation of Cath Lab at 1949.  Cath Lab called at that time.  Nitro glycerin infusion increased  to 30 mcg/min.  Cath Lab team came to the emergency department and patient left the emergency department to go to Cath Lab at 2022.  Transfer to Cath Lab was uneventful.  Patient remained alert and oriented x4, GCS 15, mild chest pressure, blood pressure stable with heart rate in the mid 30s.    CRITICAL CARE:  Please see Attending Note    FINAL IMPRESSION      1. ST elevation myocardial infarction (STEMI), unspecified artery (HCC)          DISPOSITION / PLAN     DISPOSITION Admitted 01/17/2021 08:18:23 PM    PATIENT REFERRED TO:  No follow-up provider specified.    DISCHARGE MEDICATIONS:  New Prescriptions    No medications on file       Elisha Ponder, DO  Emergency Medicine Resident, PGY 2    (Please note that portions of this note were completed with a voice recognition program.  Efforts were made to edit the dictations but occasionally words are mis-transcribed.)       Elisha Ponder, DO  Resident  01/17/21 2115

## 2021-01-17 NOTE — H&P (Signed)
[facility]  Office: [phone]  Ardelle Anton, DO, Susy Frizzle, DO, Freddi Che, DO, Tinnie Gens Blood, DO, Salome Arnt, MD, Lowry Bowl, MD, Arnold Long, MD, Chad Cordial, MD,  Seabron Spates, MD, Yolanda Bonine, MD, Juleen Starr, DO, Ree Shay, MD,  Orion Modest, DO, Jeanmarie Plant, MD, Tiney Rouge, MD, Burman Nieves, DO, Recardo Evangelist, MD, Durwin Glaze, MD, Roxanne Gates, DO, Laveda Abbe, MD, Tanja Port, MD, Purcell Nails, MD, Minus Breeding, MD, Silvio Clayman, DO, Wilnette Kales, MD, Candis Musa, MD, Carlena Hurl, CNP,  Ival Bible, CNP, Luberta Mutter, CNP, Henry Russel, CNP,  Bess Kinds, DNP, Farrel Gordon, CNP, Glennie Hawk, CNP, Melida Gimenez, CNP, Rikki Spearing, CNP, Peggye Form, CNP, Luna Glasgow, PA-C, Reola Mosher, CNS, Minna Antis, DNP, Dereck Ligas, CNP, Harrie Jeans, CNP, Marylin Crosby, CNP         [facility]   IN-PATIENT SERVICE   [facility]    HISTORY AND PHYSICAL EXAMINATION            Date:   01/17/2021  Patient name:  Tyler Arroyo  Date of admission:  01/17/2021  6:59 PM  MRN:   [id number]  Account:  [id number]  Date of Birth:  05-02-1960  PCP:    No primary care provider on file.  Room:   OTF/OTF  Code Status:    No Order    Chief Complaint:     Chief Complaint   Patient presents with    Chest Pain       History of Present Illness:     Tyler Arroyo is a 60 y.o. male who presents as a transfer for concerns for STEMI.  Patient presented to outside facility after experiencing roughly 12 hours of chest pain and chest pressure.  Patient states that yesterday he ran a half marathon.  Then he got into a car and did an 8-hour car ride from [place] to the city.  Patient states that after his half marathon he started experiencing chest pain and chest pressure.  Throughout the day today patient has also been experiencing the symptoms.  Presented to outside facility.  At outside facility troponin was  found to be roughly 1000 with an EKG with ST elevations in the lateral leads.  Patient was asymptomatic at that time.  Given aspirin, heparin bolus, started on nitro drip.  Patient transferred to this facility.  Upon initial evaluation patient resting comfortably, no acute distress, no complaints at this time.  On nitro drip.  Denies chest pain, chest pressure, shortness of breath, nausea, diaphoresis, back pain, abdominal pain.  Patient went to cath and had 2 stents palced. No current cp. Post op management.   Past Medical History:     History reviewed. No pertinent past medical history.     Past Surgical History:     History reviewed. No pertinent surgical history.     Medications Prior to Admission:     Prior to Admission medications    Not on File        Allergies:     Patient has no known allergies.    Social History:     Tobacco:    has no history on file for tobacco use.  Alcohol:      has no history on file for alcohol use.  Drug Use:  has no history on file for drug use.    Family History:     History reviewed. No pertinent family history.    Review of  Systems:     Positive and Negative as described in HPI.    ROS as per above     Physical Exam:   BP (!) 178/97    Pulse (!) 42    Temp 98 ??F (36.7 ??C) (Oral)    Resp 15    Ht 6\' 2"  (1.88 m)    Wt 220 lb (99.8 kg)    SpO2 98%    BMI 28.25 kg/m??   Temp (24hrs), Avg:97.9 ??F (36.6 ??C), Min:97.7 ??F (36.5 ??C), Max:98 ??F (36.7 ??C)    No results for input(s): POCGLU in the last 72 hours.  No intake or output data in the 24 hours ending 01/17/21 2144    General Appearance: alert, well appearing, and in no acute distress  Mental status: oriented to person, place, and time  Head: normocephalic, atraumatic  Eye: no icterus, redness, pupils equal and reactive, extraocular eye movements intact, conjunctiva clear  Ear: normal external ear, no discharge, hearing intact  Nose: no drainage noted  Mouth: mucous membranes moist  Neck: supple, no carotid bruits, thyroid not  palpable  Lungs: Bilateral equal air entry, clear to ausculation, no wheezing, rales or rhonchi, normal effort  Cardiovascular: normal rate, regular rhythm, no murmur, gallop, rub  Abdomen: Soft, nontender, nondistended, normal bowel sounds, no hepatomegaly or splenomegaly  Neurologic: There are no new focal motor or sensory deficits, normal muscle tone and bulk, no abnormal sensation, normal speech, cranial nerves II through XII grossly intact  Skin: No gross lesions, rashes, bruising or bleeding on exposed skin area  Extremities: peripheral pulses palpable, no pedal edema or calf pain with palpation  Psych: normal affect    Investigations:      Laboratory Testing:  Recent Results (from the past 24 hour(s))   CBC with Auto Differential    Collection Time: 01/17/21  6:13 PM   Result Value Ref Range    WBC 8.6 3.5 - 11.0 k/uL    RBC 5.00 4.5 - 5.9 m/uL    Hemoglobin 14.6 13.5 - 17.5 g/dL    Hematocrit 01/19/21 41 - 53 %    MCV 86.6 80 - 100 fL    MCH 29.3 26 - 34 pg    MCHC 33.8 31 - 37 g/dL    RDW 06.3 01.6 - 01.0 %    Platelets 214 150 - 450 k/uL    MPV 8.7 6.0 - 12.0 fL    Seg Neutrophils 58 36 - 66 %    Lymphocytes 31 24 - 44 %    Monocytes 10 (H) 1 - 7 %    Eosinophils % 1 0 - 4 %    Basophils 0 0 - 2 %    Segs Absolute 5.00 1.3 - 9.1 k/uL    Absolute Lymph # 2.60 1.0 - 4.8 k/uL    Absolute Mono # 0.80 0.1 - 1.3 k/uL    Absolute Eos # 0.10 0.0 - 0.4 k/uL    Basophils Absolute 0.00 0.0 - 0.2 k/uL   Basic Metabolic Panel w/ Reflex to MG    Collection Time: 01/17/21  6:13 PM   Result Value Ref Range    Glucose 85 70 - 99 mg/dL    BUN 21 (H) 6 - 20 mg/dL    Creatinine 01/19/21 3.55 - 1.20 mg/dL    Est, Glom Filt Rate >60 >60 mL/min/1.19m2    Calcium 8.9 8.6 - 10.4 mg/dL    Sodium 75m 202 - 542 mmol/L    Potassium  4.0 3.7 - 5.3 mmol/L    Chloride 107 98 - 107 mmol/L    CO2 25 20 - 31 mmol/L    Anion Gap 9 9 - 17 mmol/L   Brain Natriuretic Peptide    Collection Time: 01/17/21  6:13 PM   Result Value Ref Range    Pro-BNP 184  <300 pg/mL   D-Dimer, Quantitative    Collection Time: 01/17/21  6:13 PM   Result Value Ref Range    D-Dimer, Quant 0.31 0.00 - 0.59 mg/L FEU   CK    Collection Time: 01/17/21  6:13 PM   Result Value Ref Range    Total CK 1,192 (H) 39 - 308 U/L   Myoglobin, Blood    Collection Time: 01/17/21  6:13 PM   Result Value Ref Range    Myoglobin 140 (H) 28 - 72 ng/mL   Troponin    Collection Time: 01/17/21  6:13 PM   Result Value Ref Range    Troponin, High Sensitivity 1,097 (HH) 0 - 22 ng/L   Troponin    Collection Time: 01/17/21  7:10 PM   Result Value Ref Range    Troponin, High Sensitivity 1,106 (HH) 0 - 22 ng/L   CBC    Collection Time: 01/17/21  7:52 PM   Result Value Ref Range    WBC 8.2 3.5 - 11.3 k/uL    RBC 4.72 4.21 - 5.77 m/uL    Hemoglobin 13.8 13.0 - 17.0 g/dL    Hematocrit 67.2 09.4 - 50.3 %    MCV 88.1 82.6 - 102.9 fL    MCH 29.2 25.2 - 33.5 pg    MCHC 33.2 28.4 - 34.8 g/dL    RDW 70.9 62.8 - 36.6 %    Platelets 210 138 - 453 k/uL    MPV 10.7 8.1 - 13.5 fL    NRBC Automated 0.0 0.0 per 100 WBC   APTT    Collection Time: 01/17/21  7:52 PM   Result Value Ref Range    PTT 39.4 (H) 20.5 - 30.5 sec   Activated clotting time    Collection Time: 01/17/21  8:47 PM   Result Value Ref Range    Activated Clotting Time 233 (H) 79 - 149 sec       Imaging/Diagnostics:  XR CHEST PORTABLE    Result Date: 01/17/2021  1. No significant change.     XR CHEST PORTABLE    Result Date: 01/17/2021  1.  No acute abnormality.       Assessment :      Hospital Problems             Last Modified POA    * (Principal) STEMI (ST elevation myocardial infarction) (HCC) 01/17/2021 Yes     #Post-PCI Mgt  -S/P POD 0 left heart cath with PCI to LAD & ROL, uncomplicated. Tolerated well. EF 55%  -Currently symptom free.  -Continue asa and brilenta BID for at least 19mo to 1 year   -Continue with HI statin  -IV access  PLAN  -post stent orders  -Obtain ekg to see if resolution from EKG findings on admission  -Monitor for 12-24 hours given risk for  arrhythmia post pci  -Echo in am, lipid panel, hba1c,   -Cardiac diet  -PRN analgesia, antimetic  -Do not trend troponins unless cardio says otherwise  -Disposition planning   -PTOT      #STEMI  -Presented with unstable angina symptoms that worsened.  -s/p anteroseptal infarct on  ekg, take n to cath lab.  -troponin on admission: > 10000  -Initially on nitro infusion pre pci -->can be DCd    #Sinus bradycardia,  chronic (patient is a marathon runner)   -Avoid BB or CCB.   -TSH      #Hypertensive Emergency (resolved)   -SBP on admission ~200s  -concern for pain driving  BP  -Resume home meds, come off

## 2021-01-17 NOTE — Consults (Signed)
[facility]  CONSULT NOTE                  Date:   01/17/2021  Patient name: Tyler Arroyo  Date of admission:  01/17/2021  6:59 PM  MRN:   [id number]  Date of Birth:  06/03/1960    Reason for Admission: acute coronary syndrome     CHIEF COMPLAINT:   chest and jaw pain     History Obtained From:  Patient and chart review     HISTORY OF PRESENT ILLNESS:      60 yr old male, no significant cardiac hx, driving from [place], presented to ED with ongoing intermittent chest pain radiating to jaw which, started infact yesterday at 10:30 AM after he finished a half marathon. ECG on presentation shows sinus bradycardia with deep Q waves and 1 mm st elevation in V1-V3, no reciprocal changes, consistent with anteroseptal infarct - acute vs subacute. BP was > 200 systolic. Started on nitro infusion with improvement in symptoms however has recurrence of jaw pain. HS trops > 1000. Decision was then made to bring to the patient to cath lab for urgent coronary angiogram and possible PCI.     Past Medical History:   has no past medical history on file.    Past Surgical History:   has no past surgical history on file.     Home Medications:    Prior to Admission medications    Not on File       Allergies:  Patient has no known allergies.    Social History:        Family History:    for early CAD    REVIEW OF SYSTEMS:    Constitutional: there has been no unanticipated weight loss. No change in functional capacity.     Eyes: No visual changes or diplopia.   ENT: No Headaches, hearing loss or vertigo. No mouth sores or sore throat.  Cardiovascular: +ve chest pain as mentioned above, dyspnea, orthopnea, palpitations or pre syncope  Respiratory: No hx of productive cough, pleuritic chest pain   Gastrointestinal: No abdominal pain, appetite loss, blood in stools. No change in bowel habits.  Genitourinary: No dysuria, trouble voiding, or hematuria.  Musculoskeletal:  No gait disturbance, No weakness or joint  complaints.  Integumentary: No rash or pruritis.  Neurological: No headache, weakness, numbness or tingling. No change in gait, balance, coordination.  Psychiatric: No anxiety, or depression.  Endocrine: No temperature intolerance. No excessive thirst, fluid intake, or urination. No tremor.  Hematologic/Lymphatic: No abnormal bruising or bleeding, blood clots or swollen lymph nodes.  Allergic/Immunologic: No nasal congestion or hives.    PHYSICAL EXAM:    Physical Examination:    BP (!) 178/97    Pulse (!) 42    Temp 98 ??F (36.7 ??C) (Oral)    Resp 15    Ht 6\' 2"  (1.88 m)    Wt 220 lb (99.8 kg)    SpO2 98%    BMI 28.25 kg/m??    Constitutional and General Appearance: alert, cooperative, in no distress   HEENT: PERRL, no cervical lymphadenopathy. Normal oral mucosa  Respiratory:  Normal excursion and expansion without use of accessory muscles  Resp Auscultation: Good respiratory effort. No for increased work of breathing. On auscultation: clear to auscultation bilaterally, no wheezing, no rales.   Cardiovascular:  The apical impulse is not displaced  Heart tones are crisp and normal. regular S1 and S2. Murmurs: None   Jugular venous  pulsation Normal  Thre is no carotid bruit   Peripheral pulses are symmetrical and full   Abdomen:  No masses or tenderness  Bowel sounds present  Extremities:   No Cyanosis or Clubbing   Lower extremity edema: No edema    Skin: Warm and dry  Neurological:  Not done     DATA:    Diagnostics:      EKG:  Marked sinus bradycardia, anteroseptal infarct     Previous cardiac testing:     None     Labs:     CBC:   Recent Labs     01/17/21  1813 01/17/21  1952   WBC 8.6 8.2   HGB 14.6 13.8   HCT 43.3 41.6   PLT 214 210     BMP:   Recent Labs     01/17/21  1813   NA 141   K 4.0   CO2 25   BUN 21*   CREATININE 1.07   LABGLOM >60   GLUCOSE 85     BNP: No results for input(s): BNP in the last 72 hours.  PT/INR: No results for input(s): PROTIME, INR in the last 72 hours.  APTT:  Recent Labs      01/17/21  1952   APTT 39.4*     CARDIAC ENZYMES:  Recent Labs     01/17/21  1813   CKTOTAL 1,192*       IMPRESSION:    Acute coronary syndrome  Anteroseptal infarct, acute vs subacute   Hypertensive emergency   Sinus bradycardia, chronic (patient is a marathon runner)     RECOMMENDATIONS:  Urgent coronary angiogram and possible PCI   Further recommendations post procedure   I discussed in detail the risks, benefits, and alternatives to the procedure including but not limited to risk of bleeding/hematoma requiring surgical intervention, contrast induced allergy and/or nephropathy, arrythmia, CVA, MI or death. The patient verbalized understanding and wishes to proceed.       Lacretia Nicks, MD, [facility]

## 2021-01-17 NOTE — ED Notes (Signed)
Pt to cath lab with cath team, ED RN and ED resident on monitor per stretcher.      Boone Master, RN  01/17/21 2022

## 2021-01-17 NOTE — Op Note (Signed)
[facility]  Cardiac catheterization note        Date:   01/17/2021  Patient name: Tyler Arroyo  Date of admission:  01/17/2021  6:59 PM  MRN:   [id number]  Date of Birth:  [birth year]    Operators:  Lacretia Nicks, MD       Pre Procedure Conscious Sedation Data:    ASA Class:    []  I []  II []  III [x]  IV    Mallampati Class:  []  I [x]  II []  III []  IV      Comments:   Patient presented with chest and jaw pain. Initial ECG showed Acute   Anteroseptal infarct. He was brought to cath lab for urgent PCI to ongoing   symptoms.       Indications:     - Acute coronary syndrome (beyond last 24 h)       Procedure:   Left heart catheterization   Selective left and right coronary angiography   Left ventriculography   PCI-LAD/RPL      Access:  []  Femoral  [x]  Radial  artery       [x]  Right  []  Left    Procedure: After informed consent was obtained with explanation of the risks and benefits, patient was brought to the cath lab. The access area was prepped and draped in sterile fashion. 1% lidocaine was used for local block. The artery was cannulated with 6  Fr sheath with brisk arterial blood return. The side port was frequently flushed and aspirated with normal saline.    Findings:  LMCA: Normal 0% stenosis.     LAD: Mid 80% stenosis with an ulcerated plaque, reduced to 0% with PTCA/DES   Apical LAD is small caliber with diffuse 50-60% stenosis   Diagonals are small and patent     RCA: Mild irregularities 10-20%.Large and dominant   RPL is large with proximal 75% eccentric stenosis, reduced to 0% with DES.   RPDA is large with mid 30-40% stenosis    Ramus: Mild irregularities 10-20%.Large caliber vessel.     The LV gram was performed in the RAO 30 position.    LVEF: 55%. LV Wall Motion: normal      Estimated Blood Loss:            []  Minimal 10 cc   [x]  Minimal < 25 cc      []  Moderate 25-50 cc         []  >50 cc     Procedure Summary        1. Two vessel coronary artery disease    2. Successful PCI / Drug  Eluting Stent of the mid Left Anterior Descending Coronary Artery (Culprit)    3. Successful PCI / Drug Eluting Stent of the proximal Right Posterolateral Coronary Artery.    4. Normal LV systolic function.        Recommendations        Routine Post Stent Orders.    Medical therapy as needed.    Risk factor modification.     Heminder meet , MD, [facility], [facility]

## 2021-01-17 NOTE — ED Notes (Signed)
Patient presents to ED as a transfer from [facility] as a potential STEMI. Patient presents with chest pain that radiates to his jaw. Patient states he ran a marathon yesterday and had this chest pain since last night. Per Antony Blackbird patient's blood pressure was consistently 200s systolic and heart rate was in the upper 30s/low 40s. Patient arrives on a Nitroglycerin infusion at 46mcg/min. Patient received 4,000 unit heparin bolus as well as Asprin prior to arrival. Patient arrives alert and oriented x4 with complaints of a headache. Dr. Loralie Champagne at bedside for assessment.     Rolene Arbour, RN  01/17/21 1910

## 2021-01-17 NOTE — ED Notes (Signed)
Report given to Marylene Land, RN from [place]. V[facility].   Report method by phone   The following was reviewed with receiving RN:   Current vital signs:  BP (!) 195/99    Pulse (!) 39    Temp 97.7 ??F (36.5 ??C) (Oral)    Resp 17    Ht 6\' 2"  (1.88 m)    Wt 220 lb (99.8 kg)    SpO2 97%    BMI 28.25 kg/m??                MEWS Score: 4     Any medication or safety alerts were reviewed. Any pending diagnostics and notifications were also reviewed, as well as any safety concerns or issues, abnormal labs, abnormal imaging, and abnormal assessment findings. Questions were answered.            , RN  01/17/21 [phone]

## 2021-01-17 NOTE — ED Provider Notes (Signed)
[facility] ED  eMERGENCY dEPARTMENT eNCOUnter   Attending Attestation     Pt Name: Tyler Arroyo  MRN: [id number]  Birthdate 16-Jan-1961  Date of evaluation: 01/17/21       Tyler Arroyo is a 60 y.o. male who presents with Hypertension, Shortness of Breath, Dental Pain, and Chest Pain      History:   Patient is here having right-sided jaw pain headache shortness of breath and some chest tightness that started yesterday after running a marathon.  Patient states that he thought maybe had a sinus infection so he went to the urgent care but his blood pressure was real high and heart rate was low so they sent him in to be evaluated.  Patient has no prior medical history.    Exam: Vitals:   Vitals:    01/17/21 1803 01/17/21 1809   BP: (!) 228/94    Pulse: (!) 38    Resp: 11    Temp: 97.7 ??F (36.5 ??C)    TempSrc: Oral    SpO2: 99%    Weight:  220 lb (99.8 kg)   Height:  6\' 2"  (1.88 m)     Heart is bradycardic but regular no murmurs.  Lungs clear to auscultation bilaterally.  Neurologically intact.    Medical decision making: EKG looks like a STEMI.  I contacted Dr. at [facility] who wanted him started on a nitro drip and given heparin bolus 4000 units IV and then sent to the ER for repeat EKG and possible Cath Lab tonight.  ER was made aware.      CRITICAL CARE: There was a high probability of clinically significant/life threatening deterioration in this patient's condition which required my urgent intervention.  Total critical care time was 30 minutes.  This excludes any time for separately reportable procedures.       I performed a history and physical examination of the patient and discussed management with the resident. I reviewed the resident???s note and agree with the documented findings and plan of care. Any areas of disagreement are noted on the chart. I was personally present for the key portions of any procedures. I have documented in the chart those procedures where I was not present during the  key portions. I have personally reviewed all images and agree with the resident's interpretation. I have reviewed the emergency nurses triage note. I agree with the chief complaint, past medical history, past surgical history, allergies, medications, social and family history as documented unless otherwise noted below. Documentation of the HPI, Physical Exam and Medical Decision Making performed by medical students or scribes is based on my personal performance of the HPI, PE and MDM. I personally evaluated and examined the patient in conjunction with the APC and agree with the assessment, treatment plan, and disposition of the patient as recorded by the APC. Additional findings are as noted.    [facility], MD  Attending Emergency  Physician              Landry Corporal, MD  01/17/21 [phone]

## 2021-01-17 NOTE — ED Triage Notes (Signed)
Mode of arrival (squad #, walk in, police, etc) : WALK IN        Chief complaint(s): CHEST PAIN, DENTAL PAIN, SOB,        Arrival Note (brief scenario, treatment PTA, etc).: pt sent from [facility] urgent care where he went  due his chest pressure, sob and jaw pain. Pt was bradycardic upon triage in the  30's, hypertensive with a systolic BP in the 200's. Pt ran a half marathon yesterday and developed chest tightness and jaw pain after finishing the race. Pt states it resolved after he took some advil.        C= "Have you ever felt that you should Cut down on your drinking?"  No  A= "Have people Annoyed you by criticizing your drinking?"  No  G= "Have you ever felt bad or Guilty about your drinking?"  No  E= "Have you ever had a drink as an Eye-opener first thing in the morning to steady your nerves or to help a hangover?"  No      Deferred []       Reason for deferring: N/A    *If yes to two or more: probable alcohol abuse.*

## 2021-01-17 NOTE — ED Notes (Signed)
Report to Boykin Reaper and Arlys John from [facility] crit care transport     Jackqulyn Livings, RN  01/17/21 1843

## 2021-01-17 NOTE — ED Notes (Signed)
Report given to [facility] RN.      Tyler Arroyo  01/17/21 1828

## 2021-01-17 NOTE — ED Provider Notes (Signed)
[facility]     Emergency Department     Faculty Attestation    I performed a history and physical examination of the patient and discussed management with the resident. I reviewed the resident???s note and agree with the documented findings and plan of care. Any areas of disagreement are noted on the chart. I was personally present for the key portions of any procedures. I have documented in the chart those procedures where I was not present during the key portions. I have reviewed the emergency nurses triage note. I agree with the chief complaint, past medical history, past surgical history, allergies, medications, social and family history as documented unless otherwise noted below.        For Physician Assistant/ Nurse Practitioner cases/documentation I have personally evaluated this patient and have completed at least one if not all key elements of the E/M (history, physical exam, and MDM). Additional findings are as noted.  I have personally seen and evaluated the patient.  I find the patient's history and physical exam are consistent with the NP/PA documentation.  I agree with the care provided, treatment rendered, disposition and follow-up plan.    The patient is transferred for evaluation of myocardial infarction.  The patient ran a marathon yesterday afterward the patient felt very ill including having chest and jaw discomfort traveled at least 8 hours home .  His evaluation at the outlying facility determined abnormal EKG with significantly elevated troponins he arrives here completely asymptomatic heart rate in the 40s blood pressure 160/97.  He is noted to be bradycardic in the 40s however he is noted to be bradycardic in the past there is no evidence of block      EKG Interpretation    Interpreted by emergency department physician    Rhythm: normal sinus   Rate: Bradycardia  Axis: left  Conduction: normal  ST Segments: Elevation at V2  T Waves: inversion v2 v3   Q Waves: V1 V2  V3    Clinical Impression:  nonspecific EKG. suspicious for anteroseptal infarct    Patient is admitted with cardiology consulted upon arrival here for further direction and care    Critical Care     Harlene Salts, M.D.  Attending Emergency  Physician            Si Raider, MD  01/17/21 [phone]

## 2021-01-18 LAB — CBC
Hematocrit: 41.6 % (ref 40.7–50.3)
Hemoglobin: 13.8 g/dL (ref 13.0–17.0)
MCH: 29.2 pg (ref 25.2–33.5)
MCHC: 33.2 g/dL (ref 28.4–34.8)
MCV: 88.1 fL (ref 82.6–102.9)
MPV: 10.7 fL (ref 8.1–13.5)
NRBC Automated: 0 per 100 WBC
Platelets: 210 10*3/uL (ref 138–453)
RBC: 4.72 m/uL (ref 4.21–5.77)
RDW: 12 % (ref 11.8–14.4)
WBC: 8.2 10*3/uL (ref 3.5–11.3)

## 2021-01-18 LAB — COMPREHENSIVE METABOLIC PANEL W/ REFLEX TO MG FOR LOW K
ALT: 27 U/L (ref 5–41)
AST: 104 U/L — ABNORMAL HIGH (ref <40)
Albumin/Globulin Ratio: 1.6 (ref 1.0–2.5)
Albumin: 3.4 g/dL — ABNORMAL LOW (ref 3.5–5.2)
Alkaline Phosphatase: 58 U/L (ref 40–129)
Anion Gap: 12 mmol/L (ref 9–17)
BUN: 14 mg/dL (ref 6–20)
CO2: 22 mmol/L (ref 20–31)
Calcium: 8.1 mg/dL — ABNORMAL LOW (ref 8.6–10.4)
Chloride: 104 mmol/L (ref 98–107)
Creatinine: 0.77 mg/dL (ref 0.70–1.20)
Est, Glom Filt Rate: >60 mL/min/{1.73_m2} (ref >60)
Glucose: 101 mg/dL — ABNORMAL HIGH (ref 70–99)
Potassium: 3.5 mmol/L — ABNORMAL LOW (ref 3.7–5.3)
Sodium: 138 mmol/L (ref 135–144)
Total Bilirubin: 0.7 mg/dL (ref 0.3–1.2)
Total Protein: 5.5 g/dL — ABNORMAL LOW (ref 6.4–8.3)

## 2021-01-18 LAB — CBC WITH AUTO DIFFERENTIAL
Absolute Eos #: 0.08 10*3/uL (ref 0.00–0.44)
Absolute Immature Granulocyte: <0.03 10*3/uL (ref 0.00–0.30)
Absolute Lymph #: 1.75 10*3/uL (ref 1.10–3.70)
Absolute Mono #: 0.77 10*3/uL (ref 0.10–1.20)
Basophils Absolute: 0.03 10*3/uL (ref 0.00–0.20)
Basophils: 0 % (ref 0–2)
Eosinophils %: 1 % (ref 1–4)
Hematocrit: 39.9 % — ABNORMAL LOW (ref 40.7–50.3)
Hemoglobin: 13.4 g/dL (ref 13.0–17.0)
Immature Granulocytes: 0 %
Lymphocytes: 23 % — ABNORMAL LOW (ref 24–43)
MCH: 29.5 pg (ref 25.2–33.5)
MCHC: 33.6 g/dL (ref 28.4–34.8)
MCV: 87.7 fL (ref 82.6–102.9)
MPV: 10.5 fL (ref 8.1–13.5)
Monocytes: 10 % (ref 3–12)
NRBC Automated: 0 per 100 WBC
Platelets: 180 10*3/uL (ref 138–453)
RBC: 4.55 m/uL (ref 4.21–5.77)
RDW: 12.1 % (ref 11.8–14.4)
Seg Neutrophils: 66 % — ABNORMAL HIGH (ref 36–65)
Segs Absolute: 4.9 10*3/uL (ref 1.50–8.10)
WBC: 7.6 10*3/uL (ref 3.5–11.3)

## 2021-01-18 LAB — LIPID PANEL
Chol/HDL Ratio: 2.7 (ref <5)
Cholesterol: 184 mg/dL (ref <200)
HDL: 67 mg/dL (ref >40)
LDL Cholesterol: 96 mg/dL (ref 0–130)
Triglycerides: 104 mg/dL (ref <150)

## 2021-01-18 LAB — EKG 12-LEAD
Atrial Rate: 35 {beats}/min
P Axis: 65 degrees
P-R Interval: 294 ms
Q-T Interval: 500 ms
QRS Duration: 98 ms
QTc Calculation (Bazett): 381 ms
R Axis: -22 degrees
T Axis: 20 degrees
Ventricular Rate: 35 {beats}/min

## 2021-01-18 LAB — TROPONIN
Troponin, High Sensitivity: 1106 ng/L (ref 0–22)
Troponin, High Sensitivity: 1649 ng/L (ref 0–22)
Troponin, High Sensitivity: 2539 ng/L (ref 0–22)

## 2021-01-18 LAB — ACTIVATED CLOTTING TIME
Activated Clotting Time: 233 s — ABNORMAL HIGH (ref 79–149)
Activated Clotting Time: 228 s — ABNORMAL HIGH (ref 79–149)

## 2021-01-18 LAB — POTASSIUM: Potassium: 4 mmol/L (ref 3.7–5.3)

## 2021-01-18 LAB — APTT
PTT: 39.4 s — ABNORMAL HIGH (ref 20.5–30.5)
PTT: 23.6 s (ref 20.5–30.5)

## 2021-01-18 MED ORDER — LISINOPRIL 10 MG PO TABS
10 MG | Freq: Every day | ORAL | Status: DC
Start: 2021-01-18 — End: 2021-01-17

## 2021-01-18 MED ORDER — HYDRALAZINE HCL 20 MG/ML IJ SOLN
20 MG/ML | Freq: Four times a day (QID) | INTRAMUSCULAR | Status: DC | PRN
Start: 2021-01-18 — End: 2021-01-20

## 2021-01-18 MED ORDER — HEPARIN SODIUM (PORCINE) 1000 UNIT/ML IJ SOLN
1000 UNIT/ML | INTRAMUSCULAR | Status: AC
Start: 2021-01-18 — End: ?

## 2021-01-18 MED ORDER — IOPAMIDOL 76 % IV SOLN
76 % | INTRAVENOUS | Status: AC
Start: 2021-01-18 — End: ?

## 2021-01-18 MED ORDER — ASPIRIN 81 MG PO CHEW
81 MG | Freq: Every day | ORAL | Status: DC
Start: 2021-01-18 — End: 2021-01-17

## 2021-01-18 MED ORDER — FAMOTIDINE 20 MG PO TABS
20 MG | Freq: Two times a day (BID) | ORAL | Status: DC
Start: 2021-01-18 — End: 2021-01-20
  Administered 2021-01-18 – 2021-01-20 (×6): 20 mg via ORAL

## 2021-01-18 MED ORDER — GLUCOSE 4 G PO CHEW
4 g | ORAL | Status: DC | PRN
Start: 2021-01-18 — End: 2021-01-20

## 2021-01-18 MED ORDER — MIDAZOLAM HCL 2 MG/2ML IJ SOLN
2 MG/ML | INTRAMUSCULAR | Status: AC
Start: 2021-01-18 — End: ?

## 2021-01-18 MED ORDER — NORMAL SALINE FLUSH 0.9 % IV SOLN
0.9 % | Freq: Two times a day (BID) | INTRAVENOUS | Status: DC
Start: 2021-01-18 — End: 2021-01-20
  Administered 2021-01-18 – 2021-01-20 (×5): 10 mL via INTRAVENOUS

## 2021-01-18 MED ORDER — TICAGRELOR 90 MG PO TABS
90 MG | Freq: Two times a day (BID) | ORAL | Status: DC
Start: 2021-01-18 — End: 2021-01-19
  Administered 2021-01-18 – 2021-01-19 (×2): 90 mg via ORAL

## 2021-01-18 MED ORDER — HYDRALAZINE HCL 20 MG/ML IJ SOLN
20 MG/ML | INTRAMUSCULAR | Status: AC
Start: 2021-01-18 — End: ?

## 2021-01-18 MED ORDER — HEPARIN (PORCINE) IN D5W 100-5 UNIT/ML-% IV SOLN RATE BASED DOSING
100-5 UNIT/ML-% | INTRAVENOUS | Status: DC
Start: 2021-01-18 — End: 2021-01-17
  Administered 2021-01-18: 01:00:00 10 [IU]/kg/h via INTRAVENOUS

## 2021-01-18 MED ORDER — ONDANSETRON HCL 4 MG/2ML IJ SOLN
4 MG/2ML | Freq: Four times a day (QID) | INTRAMUSCULAR | Status: DC | PRN
Start: 2021-01-18 — End: 2021-01-20

## 2021-01-18 MED ORDER — ACETAMINOPHEN 325 MG PO TABS
325 MG | Freq: Four times a day (QID) | ORAL | Status: DC | PRN
Start: 2021-01-18 — End: 2021-01-20
  Administered 2021-01-18 (×2): 650 mg via ORAL

## 2021-01-18 MED ORDER — BIVALIRUDIN TRIFLUOROACETATE 250 MG IV SOLR
250 MG | INTRAVENOUS | Status: AC
Start: 2021-01-18 — End: ?

## 2021-01-18 MED ORDER — METOPROLOL TARTRATE 25 MG PO TABS
25 MG | Freq: Two times a day (BID) | ORAL | Status: DC
Start: 2021-01-18 — End: 2021-01-17

## 2021-01-18 MED ORDER — LIDOCAINE HCL 1 % IJ SOLN
1 % | INTRAMUSCULAR | Status: AC
Start: 2021-01-18 — End: ?

## 2021-01-18 MED ORDER — DEXTROSE 10 % IV SOLN
10 % | INTRAVENOUS | Status: DC | PRN
Start: 2021-01-18 — End: 2021-01-20

## 2021-01-18 MED ORDER — ATORVASTATIN CALCIUM 80 MG PO TABS
80 MG | Freq: Every evening | ORAL | Status: DC
Start: 2021-01-18 — End: 2021-01-17

## 2021-01-18 MED ORDER — LISINOPRIL 10 MG PO TABS
10 MG | Freq: Every day | ORAL | Status: DC
Start: 2021-01-18 — End: 2021-01-19
  Administered 2021-01-18 (×2): 10 mg via ORAL

## 2021-01-18 MED ORDER — NITROGLYCERIN 0.4 MG SL SUBL
0.4 MG | SUBLINGUAL | Status: DC | PRN
Start: 2021-01-18 — End: 2021-01-20
  Administered 2021-01-19: 12:00:00 0.4 mg via SUBLINGUAL

## 2021-01-18 MED ORDER — ONDANSETRON 4 MG PO TBDP
4 MG | Freq: Three times a day (TID) | ORAL | Status: DC | PRN
Start: 2021-01-18 — End: 2021-01-20

## 2021-01-18 MED ORDER — TICAGRELOR 90 MG PO TABS
90 MG | ORAL | Status: AC
Start: 2021-01-18 — End: ?

## 2021-01-18 MED ORDER — POTASSIUM CHLORIDE 10 MEQ/100ML IV SOLN
10 MEQ/0ML | INTRAVENOUS | Status: DC | PRN
Start: 2021-01-18 — End: 2021-01-20

## 2021-01-18 MED ORDER — DEXTROSE 10 % IV BOLUS
INTRAVENOUS | Status: DC | PRN
Start: 2021-01-18 — End: 2021-01-20

## 2021-01-18 MED ORDER — POTASSIUM CHLORIDE CRYS ER 20 MEQ PO TBCR
20 MEQ | ORAL | Status: DC | PRN
Start: 2021-01-18 — End: 2021-01-20
  Administered 2021-01-18: 12:00:00 40 meq via ORAL

## 2021-01-18 MED ORDER — MAGNESIUM HYDROXIDE 400 MG/5ML PO SUSP
400 MG/5ML | Freq: Every day | ORAL | Status: DC | PRN
Start: 2021-01-18 — End: 2021-01-20

## 2021-01-18 MED ORDER — MAGNESIUM SULFATE IN D5W 1-5 GM/100ML-% IV SOLN
1-5 GM/100ML-% | INTRAVENOUS | Status: DC | PRN
Start: 2021-01-18 — End: 2021-01-20

## 2021-01-18 MED ORDER — ATORVASTATIN CALCIUM 80 MG PO TABS
80 MG | Freq: Every evening | ORAL | Status: DC
Start: 2021-01-18 — End: 2021-01-20
  Administered 2021-01-18 – 2021-01-20 (×3): 80 mg via ORAL

## 2021-01-18 MED ORDER — SODIUM CHLORIDE 0.9 % IV SOLN
0.9 % | INTRAVENOUS | Status: DC
Start: 2021-01-18 — End: 2021-01-18
  Administered 2021-01-18: 03:00:00 via INTRAVENOUS

## 2021-01-18 MED ORDER — NORMAL SALINE FLUSH 0.9 % IV SOLN
0.9 % | INTRAVENOUS | Status: DC | PRN
Start: 2021-01-18 — End: 2021-01-20

## 2021-01-18 MED ORDER — POTASSIUM BICARB-CITRIC ACID 20 MEQ PO TBEF
20 MEQ | ORAL | Status: DC | PRN
Start: 2021-01-18 — End: 2021-01-20

## 2021-01-18 MED ORDER — NITROGLYCERIN IN D5W 200-5 MCG/ML-% IV SOLN
200-5 MCG/ML-% | INTRAVENOUS | Status: DC
Start: 2021-01-18 — End: 2021-01-18
  Administered 2021-01-18: 01:00:00 30 ug/min via INTRAVENOUS

## 2021-01-18 MED ORDER — VERAPAMIL HCL 2.5 MG/ML IV SOLN
2.5 MG/ML | INTRAVENOUS | Status: AC
Start: 2021-01-18 — End: ?

## 2021-01-18 MED ORDER — SODIUM CHLORIDE 0.9 % IV SOLN
0.9 % | INTRAVENOUS | Status: DC | PRN
Start: 2021-01-18 — End: 2021-01-20

## 2021-01-18 MED ORDER — VALSARTAN 80 MG PO TABS
80 MG | Freq: Two times a day (BID) | ORAL | Status: DC
Start: 2021-01-18 — End: 2021-01-17

## 2021-01-18 MED ORDER — ACETAMINOPHEN 325 MG PO TABS
325 MG | ORAL | Status: DC | PRN
Start: 2021-01-18 — End: 2021-01-17

## 2021-01-18 MED ORDER — GLUCAGON HCL RDNA (DIAGNOSTIC) 1 MG IJ SOLR
1 MG | INTRAMUSCULAR | Status: DC | PRN
Start: 2021-01-18 — End: 2021-01-20

## 2021-01-18 MED ORDER — HEPARIN SODIUM (PORCINE) 1000 UNIT/ML IJ SOLN
1000 UNIT/ML | INTRAMUSCULAR | Status: DC | PRN
Start: 2021-01-18 — End: 2021-01-17

## 2021-01-18 MED ORDER — ONDANSETRON HCL 4 MG/2ML IJ SOLN
4 MG/2ML | Freq: Four times a day (QID) | INTRAMUSCULAR | Status: DC | PRN
Start: 2021-01-18 — End: 2021-01-17

## 2021-01-18 MED ORDER — AMLODIPINE BESYLATE 5 MG PO TABS
5 MG | Freq: Every day | ORAL | Status: DC
Start: 2021-01-18 — End: 2021-01-19
  Administered 2021-01-18 (×2): 5 mg via ORAL

## 2021-01-18 MED ORDER — ACETAMINOPHEN 650 MG RE SUPP
650 MG | Freq: Four times a day (QID) | RECTAL | Status: DC | PRN
Start: 2021-01-18 — End: 2021-01-20

## 2021-01-18 MED ORDER — NORMAL SALINE FLUSH 0.9 % IV SOLN
0.9 % | Freq: Two times a day (BID) | INTRAVENOUS | Status: DC
Start: 2021-01-18 — End: 2021-01-20
  Administered 2021-01-19 – 2021-01-20 (×3): 10 mL via INTRAVENOUS

## 2021-01-18 MED ORDER — ASPIRIN 81 MG PO CHEW
81 MG | Freq: Every day | ORAL | Status: DC
Start: 2021-01-18 — End: 2021-01-20
  Administered 2021-01-18 – 2021-01-20 (×3): 81 mg via ORAL

## 2021-01-18 MED ORDER — LABETALOL HCL 5 MG/ML IV SOLN
5 MG/ML | Freq: Four times a day (QID) | INTRAVENOUS | Status: DC | PRN
Start: 2021-01-18 — End: 2021-01-18

## 2021-01-18 MED ORDER — FENTANYL CITRATE (PF) 100 MCG/2ML IJ SOLN
100 MCG/2ML | INTRAMUSCULAR | Status: AC
Start: 2021-01-18 — End: ?

## 2021-01-18 MED FILL — VALSARTAN 80 MG PO TABS: 80 MG | ORAL | Qty: 1

## 2021-01-18 MED FILL — MIDAZOLAM HCL 2 MG/2ML IJ SOLN: 2 MG/ML | INTRAMUSCULAR | Qty: 2

## 2021-01-18 MED FILL — ASPIRIN LOW STRENGTH 81 MG PO CHEW: 81 MG | ORAL | Qty: 1

## 2021-01-18 MED FILL — METOPROLOL TARTRATE 25 MG PO TABS: 25 MG | ORAL | Qty: 1

## 2021-01-18 MED FILL — LIPITOR 80 MG PO TABS: 80 MG | ORAL | Qty: 1

## 2021-01-18 MED FILL — AMLODIPINE BESYLATE 5 MG PO TABS: 5 MG | ORAL | Qty: 1

## 2021-01-18 MED FILL — VERAPAMIL HCL 2.5 MG/ML IV SOLN: 2.5 MG/ML | INTRAVENOUS | Qty: 2

## 2021-01-18 MED FILL — FAMOTIDINE 20 MG PO TABS: 20 MG | ORAL | Qty: 1

## 2021-01-18 MED FILL — ISOVUE-370 76 % IV SOLN: 76 % | INTRAVENOUS | Qty: 100

## 2021-01-18 MED FILL — FENTANYL CITRATE (PF) 100 MCG/2ML IJ SOLN: 100 MCG/2ML | INTRAMUSCULAR | Qty: 2

## 2021-01-18 MED FILL — HYDRALAZINE HCL 20 MG/ML IJ SOLN: 20 MG/ML | INTRAMUSCULAR | Qty: 1

## 2021-01-18 MED FILL — LISINOPRIL 10 MG PO TABS: 10 MG | ORAL | Qty: 1

## 2021-01-18 MED FILL — LIDOCAINE HCL 1 % IJ SOLN: 1 % | INTRAMUSCULAR | Qty: 20

## 2021-01-18 MED FILL — HEPARIN SODIUM (PORCINE) 1000 UNIT/ML IJ SOLN: 1000 UNIT/ML | INTRAMUSCULAR | Qty: 10

## 2021-01-18 MED FILL — BIVALIRUDIN TRIFLUOROACETATE 250 MG IV SOLR: 250 MG | INTRAVENOUS | Qty: 250

## 2021-01-18 MED FILL — ACETAMINOPHEN 325 MG PO TABS: 325 MG | ORAL | Qty: 2

## 2021-01-18 MED FILL — BRILINTA 90 MG PO TABS: 90 MG | ORAL | Qty: 1

## 2021-01-18 MED FILL — BRILINTA 90 MG PO TABS: 90 MG | ORAL | Qty: 2

## 2021-01-18 MED FILL — HEPARIN SOD (PORCINE) IN D5W 100 UNIT/ML IV SOLN: 100 UNIT/ML | INTRAVENOUS | Qty: 250

## 2021-01-18 MED FILL — POTASSIUM CHLORIDE CRYS ER 20 MEQ PO TBCR: 20 MEQ | ORAL | Qty: 2

## 2021-01-18 NOTE — Care Coordination-Inpatient (Signed)
Case Management Assessment  Initial Evaluation    Date/Time of Evaluation: 01/18/2021 10:55 AM  Assessment Completed by: Recardo Evangelist    If patient is discharged prior to next notation, then this note serves as note for discharge by case management.    Patient Name: Tyler Arroyo                   Date of Birth: [birth year]  Diagnosis: STEMI (ST elevation myocardial infarction) (HCC) [I21.3]  ST elevation myocardial infarction (STEMI), unspecified artery [facility]) [I21.3]                   Date / Time: 01/17/2021  6:59 PM    Patient Admission Status: Inpatient   Readmission Risk (Low < 19, Mod (19-27), High > 27): Readmission Risk Score: 7    Current PCP: No primary care provider on file.  PCP verified by CM? Yes    Chart Reviewed: Yes      History Provided by: Patient  Patient Orientation: Alert and Oriented    Patient Cognition: Alert    Hospitalization in the last 30 days (Readmission):  No    If yes, Readmission Assessment in CM Navigator will be completed.    Advance Directives:      Code Status: Full Code   Patient's Primary Decision Maker is: Legal Next of Kin      Discharge Planning:    Patient lives with: Spouse/Significant Other Type of Home: House  Primary Care Giver: Self  Patient Support Systems include: Spouse/Significant Other   Current Financial resources:    Current community resources:    Current services prior to admission: None            Current DME:              Type of Home Care services:  None    ADLS  Prior functional level: Independent in ADLs/IADLs  Current functional level: Independent in ADLs/IADLs    PT AM-PAC:   /24  OT AM-PAC:   /24    Family can provide assistance at DC: Yes  Would you like Case Management to discuss the discharge plan with any other family members/significant others, and if so, who? No  Plans to Return to Present Housing: Yes  Other Identified Issues/Barriers to RETURNING to current housing: Multi level home, likely can stay on main level.  Potential  Assistance needed at discharge:  none             Potential DME:  none  Patient expects to discharge to: House  Plan for transportation at discharge: Self    Financial    Does insurance require precert for SNF: Yes    Potential assistance Purchasing Medications: No  Meds-to-Beds request: Yes    No Pharmacies Listed    Notes:    Factors facilitating achievement of predicted outcomes: Family support, Motivated, Cooperative, Pleasant, and Good insight into deficits    Barriers to discharge: Medical complications    Additional Case Management Notes: Pt plans to transition home, wife to transport.     The Plan for Transition of Care is related to the following treatment goals of STEMI (ST elevation myocardial infarction) (HCC) [I21.3]  ST elevation myocardial infarction (STEMI), unspecified artery (HCC) [I21.3]    IF APPLICABLE: The Patient and/or patient representative Isaiyah and his family were provided with a choice of provider and agrees with the discharge plan. Freedom of choice list with basic dialogue that supports the patient's individualized plan  of care/goals and shares the quality data associated with the providers was provided to:     Patient Representative Name:       The Patient and/or Patient Representative Agree with the Discharge Plan?      Recardo Evangelist  Case Management Department  Ph: [phone]  Fax:

## 2021-01-18 NOTE — Care Coordination-Inpatient (Signed)
Pt provided with manufacturer discount card for Brilinta.

## 2021-01-18 NOTE — Progress Notes (Addendum)
[facility]   Progress Note                   Date:   01/18/2021  Patient name: Tyler Arroyo  Date of admission:  01/17/2021  6:59 PM  MRN:   [id number]  Date of Birth:  [birth year]  PCP: No primary care provider on file.    Reason for Admission:     Subjective:       Seen and examined.  Overnight events noted.  Currently he is chest pain-free.      Intake/Output Summary (Last 24 hours) at 01/18/2021 0755  Last data filed at 01/18/2021 0641  Gross per 24 hour   Intake 1100 ml   Output 1200 ml   Net -100 ml           Medications:   Scheduled Meds:   sodium chloride flush  5-40 mL IntraVENous 2 times per day    atorvastatin  80 mg Oral Nightly    aspirin  81 mg Oral Daily    sodium chloride flush  5-40 mL IntraVENous 2 times per day    famotidine  20 mg Oral BID    ticagrelor  90 mg Oral BID    amLODIPine  5 mg Oral Daily    lisinopril  10 mg Oral Daily     Continuous Infusions:   sodium chloride      sodium chloride 100 mL/hr at 01/17/21 2245    sodium chloride      dextrose       CBC:   Recent Labs     01/17/21  1813 01/17/21  1952 01/18/21  0303   WBC 8.6 8.2 7.6   HGB 14.6 13.8 13.4   PLT 214 210 180     BMP:    Recent Labs     01/17/21  1813 01/18/21  0303   NA 141 138   K 4.0 3.5*   CL 107 104   CO2 25 22   BUN 21* 14   CREATININE 1.07 0.77   GLUCOSE 85 101*     Hepatic:   Recent Labs     01/18/21  0303   AST 104*   ALT 27   BILITOT 0.7   ALKPHOS 58     Troponin: No results for input(s): TROPONINI in the last 72 hours.  BNP: No results for input(s): BNP in the last 72 hours.  Lipids:   Recent Labs     01/18/21  0303   CHOL 184   HDL 67     INR: No results for input(s): INR in the last 72 hours.    Objective:   Vitals: BP 123/63    Pulse (!) 36    Temp 98.5 ??F (36.9 ??C)    Resp 13    Ht 6\' 2"  (1.88 m)    Wt 220 lb (99.8 kg)    SpO2 97%    BMI 28.25 kg/m??   General appearance: alert and cooperative with exam  HEENT: Head: Normocephalic, no lesions, without obvious abnormality.  Neck: no  adenopathy, no carotid bruit, no JVD, supple, symmetrical, trachea midline and thyroid not enlarged, symmetric, no tenderness/mass/nodules  Lungs: clear to auscultation bilaterally  Heart: regular rate and rhythm, S1, S2 normal, no murmur, click, rub or gallop  Abdomen: soft, non-tender; bowel sounds normal; no masses,  no organomegaly  Extremities: extremities normal, atraumatic, no cyanosis or edema  Neurologic: Mental status: Alert, oriented, thought content appropriate  ECHO: Ordered.  Cardiac Angiography: 01/17/2021  LMCA: Normal 0% stenosis.     LAD: Mid 80% stenosis with an ulcerated plaque, reduced to 0% with PTCA/DES   Apical LAD is small caliber with diffuse 50-60% stenosis   Diagonals are small and patent      RCA: Mild irregularities 10-20%.Large and dominant   RPL is large with proximal 75% eccentric stenosis, reduced to 0% with DES.   RPDA is large with mid 30-40% stenosis     Ramus: Mild irregularities 10-20%.Large caliber vessel.      The LV gram was performed in the RAO 30 position.    LVEF: 55%. LV Wall Motion: normal      Procedure Summary        1. Two vessel coronary artery disease    2. Successful PCI / Drug Eluting Stent of the mid Left Anterior Descending Coronary Artery (Culprit)    3. Successful PCI / Drug Eluting Stent of the proximal Right Posterolateral Coronary Artery.    4. Normal LV systolic function.        Recommendations        Routine Post Stent Orders.    Medical therapy as needed.    Risk factor modification.        IMPRESSION:    NSTEMI type I with urgent cardiac cath 11/13 status post PCI/DES to mid LAD and proximal RPL.  Preserved LVEF on last echocardiogram.  Hypertensive emergency   Sinus bradycardia, chronic (patient is a marathon runner)      RECOMMENDATIONS:  Continue postop medical therapy.  Continue aspirin, statin, Brilinta, amlodipine.  Nitro as needed.  Continue to trend troponin.  Will follow echocardiogram.  PT OT.  Incentive spirometry.    Lauralee Evener, MD        Cardiovascular Fellow    I performed a history and physical examination of the patient and discussed management with the resident. I reviewed the resident???s note and agree with the documented findings and plan of care. Any areas of disagreement are noted on the chart. I was personally present for the key portions of any procedures. I have documented in the chart those procedures where I was not present during the key portions. I have personally evaluated this patient and have completed at least one if not all key elements of the E/M (history, physical exam, and MDM). Additional findings are as noted.    Pearletha Furl, MD

## 2021-01-18 NOTE — Plan of Care (Signed)
Discharge instructions started

## 2021-01-18 NOTE — Progress Notes (Signed)
[name]  Office: [phone]  Ardelle Anton, DO, Susy Frizzle, DO, Freddi Che, DO, Tinnie Gens Blood, DO, Salome Arnt, MD, Lowry Bowl, MD, Arnold Long, MD, Chad Cordial, MD,  Seabron Spates, MD, Yolanda Bonine, MD, Juleen Starr, DO, Ree Shay, MD,  Orion Modest, DO, Jeanmarie Plant, MD, Tiney Rouge, MD, Burman Nieves, DO, Recardo Evangelist, MD, Durwin Glaze, MD, Roxanne Gates, DO, Laveda Abbe, MD, Tanja Port, MD, Purcell Nails, MD, Minus Breeding, MD, Silvio Clayman, DO, Wilnette Kales, MD, Candis Musa, MD, Carlena Hurl, CNP,  Ival Bible, CNP, Luberta Mutter, CNP, Henry Russel, CNP,  Bess Kinds, DNP, Farrel Gordon, CNP, Glennie Hawk, CNP, Melida Gimenez, CNP, Rikki Spearing, CNP, Peggye Form, CNP, Luna Glasgow, PA-C, Reola Mosher, CNS, Minna Antis, DNP, Dereck Ligas, CNP, Harrie Jeans, CNP, Marylin Crosby, CNP         [facility]   IN-PATIENT SERVICE   [facility]    Progress Note    01/18/2021    11:18 AM    Name:   Tyler Arroyo  MRN:     [id number]     Acct:      [id number]   Room:   1234567890  IP Day:  1  Admit Date:  01/17/2021  6:59 PM    PCP:   No primary care provider on file.  Code Status:  Full Code    Subjective:     C/C:   Chief Complaint   Patient presents with    Chest Pain     Interval History Status: significantly improved.     Patient received urgent cardiac cath for NSTEMI status post PCI/DES to mid LAD and proximal RPL.    BP Well controlled   Denies any chest pain, has persistent left upper tooth pain. Better though.     Brief History:   Tyler Arroyo is a 60 y.o. male who presents as a transfer for concerns for STEMI.  Patient presented to outside facility after experiencing roughly 12 hours of chest pain and chest pressure.  Patient states that yesterday he ran a half marathon.  Then he got into a car and did an 8-hour car ride from [place] to the city.  Patient states that after his half  marathon he started experiencing chest pain and chest pressure.  Throughout the day today patient has also been experiencing the symptoms.  Presented to outside facility.  At outside facility troponin was found to be roughly 1000 with an EKG with ST elevations in the lateral leads.  Patient was asymptomatic at that time.  Given aspirin, heparin bolus, started on nitro drip.  Patient transferred to this facility.  Upon initial evaluation patient resting comfortably, no acute distress, no complaints at this time.  On nitro drip.  Denies chest pain, chest pressure, shortness of breath, nausea, diaphoresis, back pain, abdominal pain.      Review of Systems:     Constitutional:  negative for chills, fevers, sweats  Respiratory:  negative for cough, dyspnea on exertion, shortness of breath, wheezing  Cardiovascular:  negative for chest pain, chest pressure/discomfort, lower extremity edema, palpitations  Gastrointestinal:  negative for abdominal pain, constipation, diarrhea, nausea, vomiting  Neurological:  negative for dizziness, headache    Medications:     Allergies:  No Known Allergies    Current Meds:   Scheduled Meds:    sodium chloride flush  5-40 mL IntraVENous 2 times per day    atorvastatin  80 mg Oral Nightly  aspirin  81 mg Oral Daily    sodium chloride flush  5-40 mL IntraVENous 2 times per day    famotidine  20 mg Oral BID    ticagrelor  90 mg Oral BID    amLODIPine  5 mg Oral Daily    lisinopril  10 mg Oral Daily     Continuous Infusions:    sodium chloride      sodium chloride 100 mL/hr at 01/17/21 2245    sodium chloride      dextrose       PRN Meds: sodium chloride flush, sodium chloride, potassium chloride **OR** potassium alternative oral replacement **OR** potassium chloride, magnesium sulfate, ondansetron **OR** ondansetron, acetaminophen **OR** acetaminophen, magnesium hydroxide, nitroGLYCERIN, sodium chloride flush, sodium chloride, glucose, dextrose bolus **OR** dextrose bolus, glucagon (rDNA),  dextrose    Data:     Past Medical History:   has no past medical history on file.    Social History:        Family History: History reviewed. No pertinent family history.    Vitals:  BP 132/81    Pulse (!) 41    Temp 97.8 ??F (36.6 ??C)    Resp 10    Ht 6\' 2"  (1.88 m)    Wt 220 lb (99.8 kg)    SpO2 95%    BMI 28.25 kg/m??   Temp (24hrs), Avg:98.2 ??F (36.8 ??C), Min:97.7 ??F (36.5 ??C), Max:98.5 ??F (36.9 ??C)    No results for input(s): POCGLU in the last 72 hours.    I/O (24Hr):    Intake/Output Summary (Last 24 hours) at 01/18/2021 1118  Last data filed at 01/18/2021 0838  Gross per 24 hour   Intake 1100 ml   Output 1600 ml   Net -500 ml       Labs:  Hematology:  Recent Labs     01/17/21  1813 01/17/21  1952 01/18/21  0303   WBC 8.6 8.2 7.6   RBC 5.00 4.72 4.55   HGB 14.6 13.8 13.4   HCT 43.3 41.6 39.9*   MCV 86.6 88.1 87.7   MCH 29.3 29.2 29.5   MCHC 33.8 33.2 33.6   RDW 13.0 12.0 12.1   PLT 214 210 180   MPV 8.7 10.7 10.5   DDIMER 0.31  --   --      Chemistry:  Recent Labs     01/17/21  1813 01/17/21  1910 01/18/21  0303 01/18/21  0728   NA 141  --  138  --    K 4.0  --  3.5*  --    CL 107  --  104  --    CO2 25  --  22  --    GLUCOSE 85  --  101*  --    BUN 21*  --  14  --    CREATININE 1.07  --  0.77  --    ANIONGAP 9  --  12  --    LABGLOM >60  --  >60  --    CALCIUM 8.9  --  8.1*  --    PROBNP 184  --   --   --    TROPHS 1,097* 1,106*  --  1,649*   CKTOTAL 1,192*  --   --   --    MYOGLOBIN 140*  --   --   --      Recent Labs     01/18/21  0303   PROT 5.5*  LABALBU 3.4*   AST 104*   ALT 27   ALKPHOS 58   BILITOT 0.7   CHOL 184   HDL 67   LDLCHOLESTEROL 96   CHOLHDLRATIO 2.7   TRIG 104     ABG:No results found for: POCPH, PHART, PH, POCPCO2, PCO2ART, PCO2, POCPO2, PO2ART, PO2, POCHCO3, HCO3ART, HCO3, NBEA, PBEA, BEART, BE, THGBART, THB, TCO2ART, POCO2SAT, O2SATART, O2SAT, FIO2  No results found for: SPECIAL  No results found for: CULTURE    Radiology:  XR CHEST PORTABLE    Result Date: 01/17/2021  1. No significant  change.     XR CHEST PORTABLE    Result Date: 01/17/2021  1.  No acute abnormality.       Physical Examination:        General appearance:  alert, cooperative and no distress  Mental Status:  oriented to person, place and time and normal affect  Lungs:  clear to auscultation bilaterally, normal effort  Heart:  regular rate and rhythm, no murmur  Abdomen:  soft, nontender, nondistended, normal bowel sounds, no masses, hepatomegaly, splenomegaly  Extremities:  no edema, redness, tenderness in the calves  Skin:  no gross lesions, rashes, induration    Assessment:        Hospital Problems             Last Modified POA    * (Principal) NSTEMI (non-ST elevated myocardial infarction) (HCC) 01/17/2021 Yes   Sinus Bradycardia  Hypertension  hypokalemia       Plan:        Continue current medications  Cardiac diet      Tanja Port, MD  01/18/2021  11:18 AM

## 2021-01-19 ENCOUNTER — Inpatient Hospital Stay: Admit: 2021-01-19 | Payer: BLUE CROSS/BLUE SHIELD

## 2021-01-19 LAB — EKG 12-LEAD
Atrial Rate: 43 {beats}/min
Atrial Rate: 0 {beats}/min
P Axis: 54 degrees
P-R Interval: 270 ms
Q-T Interval: 508 ms
Q-T Interval: 510 ms
QRS Duration: 118 ms
QRS Duration: 102 ms
QTc Calculation (Bazett): 429 ms
QTc Calculation (Bazett): 394 ms
R Axis: -28 degrees
R Axis: -31 degrees
T Axis: 6 degrees
T Axis: 15 degrees
Ventricular Rate: 43 {beats}/min
Ventricular Rate: 36 {beats}/min

## 2021-01-19 LAB — CBC WITH AUTO DIFFERENTIAL
Absolute Eos #: 0.04 10*3/uL (ref 0.00–0.44)
Absolute Immature Granulocyte: 0.03 10*3/uL (ref 0.00–0.30)
Absolute Lymph #: 1.34 10*3/uL (ref 1.10–3.70)
Absolute Mono #: 0.94 10*3/uL (ref 0.10–1.20)
Basophils Absolute: 0.03 10*3/uL (ref 0.00–0.20)
Basophils: 0 % (ref 0–2)
Eosinophils %: 1 % (ref 1–4)
Hematocrit: 42.2 % (ref 40.7–50.3)
Hemoglobin: 13.9 g/dL (ref 13.0–17.0)
Immature Granulocytes: 0 %
Lymphocytes: 16 % — ABNORMAL LOW (ref 24–43)
MCH: 29.3 pg (ref 25.2–33.5)
MCHC: 32.9 g/dL (ref 28.4–34.8)
MCV: 88.8 fL (ref 82.6–102.9)
MPV: 10.9 fL (ref 8.1–13.5)
Monocytes: 11 % (ref 3–12)
NRBC Automated: 0 per 100 WBC
Platelets: 189 10*3/uL (ref 138–453)
RBC: 4.75 m/uL (ref 4.21–5.77)
RDW: 11.9 % (ref 11.8–14.4)
Seg Neutrophils: 72 % — ABNORMAL HIGH (ref 36–65)
Segs Absolute: 6.24 10*3/uL (ref 1.50–8.10)
WBC: 8.6 10*3/uL (ref 3.5–11.3)

## 2021-01-19 LAB — BASIC METABOLIC PANEL W/ REFLEX TO MG FOR LOW K
Anion Gap: 10 mmol/L (ref 9–17)
BUN: 13 mg/dL (ref 6–20)
CO2: 21 mmol/L (ref 20–31)
Calcium: 8.5 mg/dL — ABNORMAL LOW (ref 8.6–10.4)
Chloride: 103 mmol/L (ref 98–107)
Creatinine: 0.81 mg/dL (ref 0.70–1.20)
Est, Glom Filt Rate: >60 mL/min/{1.73_m2} (ref >60)
Glucose: 104 mg/dL — ABNORMAL HIGH (ref 70–99)
Potassium: 4.1 mmol/L (ref 3.7–5.3)
Sodium: 134 mmol/L — ABNORMAL LOW (ref 135–144)

## 2021-01-19 LAB — ECHOCARDIOGRAM COMPLETE 2D W DOPPLER W COLOR: Left Ventricular Ejection Fraction: 55

## 2021-01-19 LAB — TROPONIN: Troponin, High Sensitivity: 3163 ng/L (ref 0–22)

## 2021-01-19 MED ORDER — VERAPAMIL HCL 2.5 MG/ML IV SOLN
2.5 MG/ML | INTRAVENOUS | Status: AC
Start: 2021-01-19 — End: ?

## 2021-01-19 MED ORDER — MIDAZOLAM HCL 2 MG/2ML IJ SOLN
2 MG/ML | INTRAMUSCULAR | Status: AC
Start: 2021-01-19 — End: ?

## 2021-01-19 MED ORDER — MELATONIN 5 MG PO TABS
5 MG | Freq: Once | ORAL | Status: AC
Start: 2021-01-19 — End: 2021-01-19
  Administered 2021-01-19: 08:00:00 10 mg via ORAL

## 2021-01-19 MED ORDER — PRASUGREL HCL 10 MG PO TABS
10 MG | Freq: Once | ORAL | Status: DC
Start: 2021-01-19 — End: 2021-01-19

## 2021-01-19 MED ORDER — PRASUGREL HCL 10 MG PO TABS
10 MG | Freq: Once | ORAL | Status: AC
Start: 2021-01-19 — End: 2021-01-19
  Administered 2021-01-20: 02:00:00 30 mg via ORAL

## 2021-01-19 MED ORDER — FENTANYL CITRATE (PF) 100 MCG/2ML IJ SOLN
100 MCG/2ML | INTRAMUSCULAR | Status: AC
Start: 2021-01-19 — End: ?

## 2021-01-19 MED ORDER — HEPARIN SODIUM (PORCINE) 5000 UNIT/ML IJ SOLN
5000 UNIT/ML | Freq: Three times a day (TID) | INTRAMUSCULAR | Status: DC
Start: 2021-01-19 — End: 2021-01-20
  Administered 2021-01-19 – 2021-01-20 (×4): 5000 [IU] via SUBCUTANEOUS

## 2021-01-19 MED ORDER — PRASUGREL HCL 10 MG PO TABS
10 MG | Freq: Every day | ORAL | Status: DC
Start: 2021-01-19 — End: 2021-01-20
  Administered 2021-01-20: 14:00:00 10 mg via ORAL

## 2021-01-19 MED ORDER — AMLODIPINE BESYLATE 5 MG PO TABS
5 MG | Freq: Every evening | ORAL | Status: DC
Start: 2021-01-19 — End: 2021-01-20

## 2021-01-19 MED ORDER — ISOSORBIDE MONONITRATE ER 30 MG PO TB24
30 MG | Freq: Every day | ORAL | Status: DC
Start: 2021-01-19 — End: 2021-01-20
  Administered 2021-01-19 – 2021-01-20 (×2): 15 mg via ORAL

## 2021-01-19 MED ORDER — IOPAMIDOL 76 % IV SOLN
76 % | INTRAVENOUS | Status: AC
Start: 2021-01-19 — End: ?

## 2021-01-19 MED ORDER — LIDOCAINE HCL 1 % IJ SOLN
1 % | INTRAMUSCULAR | Status: AC
Start: 2021-01-19 — End: ?

## 2021-01-19 MED FILL — MIDAZOLAM HCL 2 MG/2ML IJ SOLN: 2 MG/ML | INTRAMUSCULAR | Qty: 2

## 2021-01-19 MED FILL — LISINOPRIL 10 MG PO TABS: 10 MG | ORAL | Qty: 1

## 2021-01-19 MED FILL — VERAPAMIL HCL 2.5 MG/ML IV SOLN: 2.5 MG/ML | INTRAVENOUS | Qty: 2

## 2021-01-19 MED FILL — FAMOTIDINE 20 MG PO TABS: 20 MG | ORAL | Qty: 1

## 2021-01-19 MED FILL — HEPARIN SODIUM (PORCINE) 5000 UNIT/ML IJ SOLN: 5000 UNIT/ML | INTRAMUSCULAR | Qty: 1

## 2021-01-19 MED FILL — BRILINTA 90 MG PO TABS: 90 MG | ORAL | Qty: 1

## 2021-01-19 MED FILL — FENTANYL CITRATE (PF) 100 MCG/2ML IJ SOLN: 100 MCG/2ML | INTRAMUSCULAR | Qty: 2

## 2021-01-19 MED FILL — LIDOCAINE HCL 1 % IJ SOLN: 1 % | INTRAMUSCULAR | Qty: 50

## 2021-01-19 MED FILL — ASPIRIN LOW STRENGTH 81 MG PO CHEW: 81 MG | ORAL | Qty: 1

## 2021-01-19 MED FILL — ISOVUE-370 76 % IV SOLN: 76 % | INTRAVENOUS | Qty: 100

## 2021-01-19 MED FILL — AMLODIPINE BESYLATE 5 MG PO TABS: 5 MG | ORAL | Qty: 1

## 2021-01-19 MED FILL — NITROGLYCERIN 0.4 MG SL SUBL: 0.4 MG | SUBLINGUAL | Qty: 25

## 2021-01-19 MED FILL — LIPITOR 80 MG PO TABS: 80 MG | ORAL | Qty: 1

## 2021-01-19 MED FILL — PRASUGREL HCL 10 MG PO TABS: 10 MG | ORAL | Qty: 3

## 2021-01-19 MED FILL — ISOSORBIDE MONONITRATE ER 30 MG PO TB24: 30 MG | ORAL | Qty: 1

## 2021-01-19 MED FILL — MELATONIN 5 MG PO TABS: 5 MG | ORAL | Qty: 2

## 2021-01-19 NOTE — Progress Notes (Signed)
[facility]  Office: [phone]  Ardelle Anton, DO, Susy Frizzle, DO, Freddi Che, DO, Tinnie Gens Blood, DO, Salome Arnt, MD, Lowry Bowl, MD, Arnold Long, MD, Chad Cordial, MD,  Seabron Spates, MD, Yolanda Bonine, MD, Juleen Starr, DO, Ree Shay, MD,  Orion Modest, DO, Jeanmarie Plant, MD, Tiney Rouge, MD, Burman Nieves, DO, Recardo Evangelist, MD, Durwin Glaze, MD, Roxanne Gates, DO, Laveda Abbe, MD, Tanja Port, MD, Purcell Nails, MD, Minus Breeding, MD, Silvio Clayman, DO, Wilnette Kales, MD, Candis Musa, MD, Carlena Hurl, CNP,  Ival Bible, CNP, Luberta Mutter, CNP, Henry Russel, CNP,  Bess Kinds, DNP, Farrel Gordon, CNP, Glennie Hawk, CNP, Melida Gimenez, CNP, Rikki Spearing, CNP, Peggye Form, CNP, Luna Glasgow, PA-C, Reola Mosher, CNS, Minna Antis, DNP, Dereck Ligas, CNP, Harrie Jeans, CNP, Marylin Crosby, CNP         [facility]   [facility]    Progress Note    01/19/2021    12:31 PM    Name:   Tyler Arroyo  MRN:     [id number]     Acct:      [id number]   Room:   1234567890  IP Day:  2  Admit Date:  01/17/2021  6:59 PM    PCP:   No primary care provider on file.  Code Status:  Full Code    Subjective:     C/C:   Chief Complaint   Patient presents with    Chest Pain     Interval History Status: improved.     Patient feels better, had increased troponin levels   Had an episode of chest pain last night       Brief History:   Tyler Arroyo is a 60 y.o. male who presents as a transfer for concerns for STEMI.  Patient presented to outside facility after experiencing roughly 12 hours of chest pain and chest pressure.  Patient states that yesterday he ran a half marathon.  Then he got into a car and did an 8-hour car ride from [place] to the city.  Patient states that after his half marathon he started experiencing chest pain and chest pressure.  Throughout the day today patient has also been  experiencing the symptoms.  Presented to outside facility.  At outside facility troponin was found to be roughly 1000 with an EKG with ST elevations in the lateral leads.  Patient was asymptomatic at that time.  Given aspirin, heparin bolus, started on nitro drip.  Patient transferred to this facility.  Upon initial evaluation patient resting comfortably, no acute distress, no complaints at this time.  On nitro drip.  Denies chest pain, chest pressure, shortness of breath, nausea, diaphoresis, back pain, abdominal pain.    Review of Systems:     Constitutional:  negative for chills, fevers, sweats  Respiratory:  negative for cough, dyspnea on exertion, shortness of breath, wheezing  Cardiovascular:  negative for chest pain, chest pressure/discomfort, lower extremity edema, palpitations  Gastrointestinal:  negative for abdominal pain, constipation, diarrhea, nausea, vomiting  Neurological:  negative for dizziness, headache    Medications:     Allergies:  No Known Allergies    Current Meds:   Scheduled Meds:    heparin (porcine)  5,000 Units SubCUTAneous 3 times per day    [START ON 01/20/2021] prasugrel  10 mg Oral Daily    [START ON 01/20/2021] amLODIPine  5 mg Oral Nightly    isosorbide mononitrate  15  mg Oral Daily    prasugrel  30 mg Oral Once    sodium chloride flush  5-40 mL IntraVENous 2 times per day    atorvastatin  80 mg Oral Nightly    aspirin  81 mg Oral Daily    sodium chloride flush  5-40 mL IntraVENous 2 times per day    famotidine  20 mg Oral BID     Continuous Infusions:    sodium chloride      sodium chloride      dextrose       PRN Meds: hydrALAZINE, sodium chloride flush, sodium chloride, potassium chloride **OR** potassium alternative oral replacement **OR** potassium chloride, magnesium sulfate, ondansetron **OR** ondansetron, acetaminophen **OR** acetaminophen, magnesium hydroxide, nitroGLYCERIN, sodium chloride flush, sodium chloride, glucose, dextrose bolus **OR** dextrose bolus, glucagon  (rDNA), dextrose    Data:     Past Medical History:   has no past medical history on file.    Social History:        Family History: History reviewed. No pertinent family history.    Vitals:  BP 128/70    Pulse (!) 38    Temp 98 ??F (36.7 ??C) (Oral)    Resp 13    Ht 6\' 2"  (1.88 m)    Wt 216 lb 14.9 oz (98.4 kg)    SpO2 96%    BMI 27.85 kg/m??   Temp (24hrs), Avg:98.3 ??F (36.8 ??C), Min:98 ??F (36.7 ??C), Max:98.8 ??F (37.1 ??C)    No results for input(s): POCGLU in the last 72 hours.    I/O (24Hr):    Intake/Output Summary (Last 24 hours) at 01/19/2021 1231  Last data filed at 01/19/2021 0700  Gross per 24 hour   Intake 950 ml   Output 1580 ml   Net -630 ml       Labs:  Hematology:  Recent Labs     01/17/21  1813 01/17/21  1952 01/18/21  0303 01/19/21  0722   WBC 8.6 8.2 7.6 8.6   RBC 5.00 4.72 4.55 4.75   HGB 14.6 13.8 13.4 13.9   HCT 43.3 41.6 39.9* 42.2   MCV 86.6 88.1 87.7 88.8   MCH 29.3 29.2 29.5 29.3   MCHC 33.8 33.2 33.6 32.9   RDW 13.0 12.0 12.1 11.9   PLT 214 210 180 189   MPV 8.7 10.7 10.5 10.9   DDIMER 0.31  --   --   --      Chemistry:  Recent Labs     01/17/21  1813 01/17/21  1910 01/18/21  0303 01/18/21  0728 01/18/21  1112 01/18/21  1811 01/19/21  0434 01/19/21  0722   NA 141  --  138  --   --   --   --  134*   K 4.0  --  3.5*  --  4.0  --   --  4.1   CL 107  --  104  --   --   --   --  103   CO2 25  --  22  --   --   --   --  21   GLUCOSE 85  --  101*  --   --   --   --  104*   BUN 21*  --  14  --   --   --   --  13   CREATININE 1.07  --  0.77  --   --   --   --  0.81   ANIONGAP 9  --  12  --   --   --   --  10   LABGLOM >60  --  >60  --   --   --   --  >60   CALCIUM 8.9  --  8.1*  --   --   --   --  8.5*   PROBNP 184  --   --   --   --   --   --   --    TROPHS 1,097*   < >  --  1,649*  --  2,539* 3,163*  --    CKTOTAL 1,192*  --   --   --   --   --   --   --    MYOGLOBIN 140*  --   --   --   --   --   --   --     < > = values in this interval not displayed.     Recent Labs     01/18/21  0303   PROT 5.5*    LABALBU 3.4*   AST 104*   ALT 27   ALKPHOS 58   BILITOT 0.7   CHOL 184   HDL 67   LDLCHOLESTEROL 96   CHOLHDLRATIO 2.7   TRIG 104     ABG:No results found for: POCPH, PHART, PH, POCPCO2, PCO2ART, PCO2, POCPO2, PO2ART, PO2, POCHCO3, HCO3ART, HCO3, NBEA, PBEA, BEART, BE, THGBART, THB, TCO2ART, POCO2SAT, O2SATART, O2SAT, FIO2  No results found for: SPECIAL  No results found for: CULTURE    Radiology:  XR CHEST PORTABLE    Result Date: 01/17/2021  1. No significant change.     XR CHEST PORTABLE    Result Date: 01/17/2021  1.  No acute abnormality.       Physical Examination:        General appearance:  alert, cooperative and no distress  Mental Status:  oriented to person, place and time and normal affect  Lungs:  clear to auscultation bilaterally, normal effort  Heart:  regular rate and rhythm, no murmur  Abdomen:  soft, nontender, nondistended, normal bowel sounds, no masses, hepatomegaly, splenomegaly  Extremities:  no edema, redness, tenderness in the calves  Skin:  no gross lesions, rashes, induration    Assessment:            (Principal) NSTEMI (non-ST elevated myocardial infarction) (HCC) 01/17/2021 Yes   Sinus Bradycardia  Hypertension  hypokalemia        Plan:        Cardiac cath planned by cardiology today   NPO for now     Tanja Port, MD  01/19/2021  12:31 PM

## 2021-01-19 NOTE — Progress Notes (Signed)
[facility] CARE DEPARTMENT - [facility]  PROGRESS NOTE    Shift date: 11.14.2022  Shift day: Tuesday   Shift # 2    Room # 1026/1026-01   Name: Tyler Arroyo                Religion: unknown   Place of worship: unknown    Referral: Routine Visit    Admit Date & Time: 01/17/2021  6:59 PM    Assessment:  Tyler Arroyo is a 60 y.o. male in the hospital. Upon entering the room writer observes patient and family appearing to be calm and coping.  Determined support to be available.       Intervention:  Clinical research associate introduced self and title as Location manager offered space for the patient  to express feelings, needs, and concerns and provided a ministry presence.     Outcome:  Patient remains calm and coping.     Plan:  Chaplains will remain available to offer spiritual and emotional support as needed.      Electronically signed by Colin Rhein, on 01/19/2021 at 7:10 PM.  Spiritual Care Department  [facility]  [phone]       01/18/21 1800   Encounter Summary   Service Provided For: Patient and family together   Referral/Consult From: Rounding   Support System Family members   Last Encounter  01/18/21   Complexity of Encounter Moderate   Begin Time 1800   End Time  1815   Total Time Calculated 15 min   Encounter    Type Initial Screen/Assessment   Assessment/Intervention/Outcome   Assessment Calm;Coping   Intervention Active listening;Explored/Affirmed feelings, thoughts, concerns   Outcome Coping     Electronically signed by Lyndon Code on 01/19/2021 at 7:10 PM

## 2021-01-19 NOTE — Progress Notes (Signed)
Echo was completed at the bedside.

## 2021-01-19 NOTE — Plan of Care (Signed)
Problem: Discharge Planning  Goal: Discharge to home or other facility with appropriate resources  Outcome: Progressing     Problem: Skin/Tissue Integrity  Goal: Absence of new skin breakdown  Description: 1.  Monitor for areas of redness and/or skin breakdown  2.  Assess vascular access sites hourly  3.  Every 4-6 hours minimum:  Change oxygen saturation probe site  4.  Every 4-6 hours:  If on nasal continuous positive airway pressure, respiratory therapy assess nares and determine need for appliance change or resting period.  Outcome: Progressing     Problem: Safety - Adult  Goal: Free from fall injury  Outcome: Progressing     Problem: Pain  Goal: Verbalizes/displays adequate comfort level or baseline comfort level  Outcome: Progressing

## 2021-01-19 NOTE — Op Note (Signed)
[facility]    CARDIAC CATHETERIZATION    Date:   01/19/2021  Patient name:  Tyler Arroyo  Date of admission:  01/17/2021  6:59 PM  MRN:   [id number]  Date of Birth:  25-May-1960    Operators:  Primary:   Richarda Blade, MD (Attending Physician)        Procedure performed:     [x]  Left Heart Catheterization.   []  Graft Angiography.  [x]  Left Ventriculography.     []  Right Heart Catheterization.  []  Coronary Angiography.    []  Aortic Valve Studies.  []  PCI:      []  Other:       Pre Procedure Conscious Sedation Data:  ASA Class:    []  I []  II [x]  III []  IV    Mallampati Class:  [x]  I []  II []  III []  IV      Indication:  []  STEMI      []  + Stress test  [x]  ACS      []  + EKG Changes  []  Non Q MI       []  Significant Risk Factors  []  Recurrent Angina             []  Diabetes Mellitus    []  New LBBB      []  Uncontrolled HTN.  []  CHF / Low EF changes     []  Abnormal CTA / Ca Score      Procedure:  Access:  []  Femoral  [x]  Radial  artery       [x]  Right  []  Left    Procedure: After informed consent was obtained with explanation of the risks and benefits, patient was brought to the cath lab. The access area was prepped and draped in sterile fashion. 1% lidocaine was used for local block. The artery was cannulated with 6  Fr sheath with brisk arterial blood return. The side port was frequently flushed and aspirated with normal saline.      Findings:   Angiographic Findings        Cardiac Arteries and Lesion Findings       LMCA: Normal 0% stenosis.     LAD: Patent stented area in D     LCx: Normal 0% stenosis.Distal small branch with 75% stenosis     RCA: Large   Patent distal stent      Coronary Tree        Dominance: Right       LV Analysis   LV function assessed .   Ejection Fraction   +----------------------------------------------------------------------+---+   !Method                                                                !EF%!    +----------------------------------------------------------------------+---+   !LV gram                                                               !55 !   +----------------------------------------------------------------------+---+     Conclusions        Procedure Summary  Patent LAD/D and RCA stents        Recommendations        Medical treatments         Estimated Blood Loss: 10 mL    ____________________________________________________________________    History and Risk Factors    [x]  Hypertension     []  Family history of CAD  [x]  Hyperlipidemia     []  Cerebrovascular Disease   [x]  Prior MI       []  Peripheral Vascular disease   []  Prior PCI              []  Diabetes Mellitus    []  Left Main PCI.     []  Currently on Dialysis.  []  Prior CABG.      []  Currently smoker.    []  Cardiac Arrest outside of healthcare facility. []  Yes    [x]  No        Witnessed     []  Yes   []  No     Arrest after arrival of EMS  []  Yes   []  No     []  Cardiac Arrest at other Facility.    []  Yes   [x]  No    Pre-Procedure Information.  Heart Failure       []  Yes    [x]  No        Class  []  I      []  II  []  III    []  IV.     New Diagnosis    []  Yes  []  No    HF Type      []  Systolic   []  Diastolic          []  Unknown.    Diagnostic Test:   EKG       []  Normal   [x]  Abnormal    New antiarrhythmia medications:    []  Yes   []  No   New onset atrial fibrillation / Flutter     []  Yes   []  No   ECG Abnormalities:      []  V. Fib   []  Sus V. Tach           []  NS V. T   []  New LBBB           []  T. Inv  []   ST dev > 0.5 mm         []  PVC's freq  []  PVC's infrequent    Stress Test Performed:      []  Yes    [x]  No     Type:     []  Stress Echo   []  Exercise Stress Test (no imaging)      []  Stress Nuclear  []  Stress Imaging     Results   []  Negative   []  Positive        []  Indeterminate  []  Unavailable     If Positive/ Risk / Extent of Ischemia:       []  Low  []  Intermediate         []  High  []  Unavailable      Cardiac CTA Performed:     []  Yes     [x]  No      Results   []  CAD   []  Non obstructive CAD      []  No CAD   []  Uncertain      []  Unknown   []  Structural Disease.     Pre Procedure Medications:   [x]  Yes    []   No         [x]  ASA   []  Beta Blockers      []  Nitrate   []  Ca Channel Blockers      []  Ranolazine   []  Statin       [x]  Plavix/Others antiplatelets      Electronically signed on 01/19/2021 at 11:43 AM by:    , MD  Fellow, Cardiovascular Diseases  [facility]    I have reviewed the case / procedure with resident / fellow  I have examined the patient personally  Patient agree with treatment plan as discussed before, final arrangement based on my evaluation and exam.    Risk and benefit of procedure planned were explained in details.    Procedure was performed by me personally, with all aspect of the procedure being done using standard protocol.    Note was modified based on my own assessment and treatment.    , MD  [facility]

## 2021-01-19 NOTE — Progress Notes (Addendum)
[facility]   Progress Note                   Date:   01/19/2021  Patient name: Tyler Arroyo  Date of admission:  01/17/2021  6:59 PM  MRN:   [id number]  Date of Birth:  1960-04-21  PCP: No primary care provider on file.    Reason for Admission:     Subjective:       Seen and examined.  Overnight events noted.  Currently he is chest pain-free.  Troponin currently trending up.  EKG with Q waves in anterior leads.  Will discuss with Dr Karie Mainland for possible repeat cath.       Intake/Output Summary (Last 24 hours) at 01/19/2021 0701  Last data filed at 01/19/2021 0215  Gross per 24 hour   Intake 1571 ml   Output 1930 ml   Net -359 ml             Medications:   Scheduled Meds:   sodium chloride flush  5-40 mL IntraVENous 2 times per day    atorvastatin  80 mg Oral Nightly    aspirin  81 mg Oral Daily    sodium chloride flush  5-40 mL IntraVENous 2 times per day    famotidine  20 mg Oral BID    ticagrelor  90 mg Oral BID    amLODIPine  5 mg Oral Daily    lisinopril  10 mg Oral Daily     Continuous Infusions:   sodium chloride      sodium chloride      dextrose       CBC:   Recent Labs     01/17/21  1813 01/17/21  1952 01/18/21  0303   WBC 8.6 8.2 7.6   HGB 14.6 13.8 13.4   PLT 214 210 180       BMP:    Recent Labs     01/17/21  1813 01/18/21  0303 01/18/21  1112   NA 141 138  --    K 4.0 3.5* 4.0   CL 107 104  --    CO2 25 22  --    BUN 21* 14  --    CREATININE 1.07 0.77  --    GLUCOSE 85 101*  --        Hepatic:   Recent Labs     01/18/21  0303   AST 104*   ALT 27   BILITOT 0.7   ALKPHOS 58       Troponin: No results for input(s): TROPONINI in the last 72 hours.  BNP: No results for input(s): BNP in the last 72 hours.  Lipids:   Recent Labs     01/18/21  0303   CHOL 184   HDL 67       INR: No results for input(s): INR in the last 72 hours.    Objective:   Vitals: BP 113/83    Pulse (!) 40    Temp 98.8 ??F (37.1 ??C) (Oral)    Resp 20    Ht 6\' 2"  (1.88 m)    Wt 216 lb 14.9 oz (98.4 kg)    SpO2 97%    BMI  27.85 kg/m??   General appearance: alert and cooperative with exam  HEENT: Head: Normocephalic, no lesions, without obvious abnormality.  Neck: no adenopathy, no carotid bruit, no JVD, supple, symmetrical, trachea midline and thyroid not enlarged, symmetric, no tenderness/mass/nodules  Lungs: clear  to auscultation bilaterally  Heart: regular rate and rhythm, S1, S2 normal, no murmur, click, rub or gallop  Abdomen: soft, non-tender; bowel sounds normal; no masses,  no organomegaly  Extremities: extremities normal, atraumatic, no cyanosis or edema  Neurologic: Mental status: Alert, oriented, thought content appropriate        ECHO: Ordered.  Cardiac Angiography: 01/17/2021  LMCA: Normal 0% stenosis.     LAD: Mid 80% stenosis with an ulcerated plaque, reduced to 0% with PTCA/DES   Apical LAD is small caliber with diffuse 50-60% stenosis   Diagonals are small and patent      RCA: Mild irregularities 10-20%.Large and dominant   RPL is large with proximal 75% eccentric stenosis, reduced to 0% with DES.   RPDA is large with mid 30-40% stenosis     Ramus: Mild irregularities 10-20%.Large caliber vessel.      The LV gram was performed in the RAO 30 position.    LVEF: 55%. LV Wall Motion: normal      Procedure Summary        1. Two vessel coronary artery disease    2. Successful PCI / Drug Eluting Stent of the mid Left Anterior Descending Coronary Artery (Culprit)    3. Successful PCI / Drug Eluting Stent of the proximal Right Posterolateral Coronary Artery.    4. Normal LV systolic function.        Recommendations        Routine Post Stent Orders.    Medical therapy as needed.    Risk factor modification.        IMPRESSION:    NSTEMI type I with urgent cardiac cath 11/13 status post PCI/DES to mid LAD and proximal RPL.  Preserved LVEF on last echocardiogram.  Hypertensive emergency   Sinus bradycardia, chronic (patient is a marathon runner)      RECOMMENDATIONS:  Continue postop medical therapy.  Continue aspirin, statin,  Brilinta, amlodipine.  Nitro as needed.  Continue to trend troponin.  Will follow echocardiogram.  PT OT.  Incentive spirometry.    Lauralee Evener, MD       Cardiovascular Fellow    I performed a history and physical examination of the patient and discussed management with the resident. I reviewed the resident???s note and agree with the documented findings and plan of care. Any areas of disagreement are noted on the chart. I was personally present for the key portions of any procedures. I have documented in the chart those procedures where I was not present during the key portions. I have personally evaluated this patient and have completed at least one if not all key elements of the E/M (history, physical exam, and MDM). Additional findings are as noted.    Trop trending up. Will repeat cardiac cath. Risk and benefits discussed. It is possible that patient has severe endothelial dysfunction and microvascular disease and with resting bradycardia causing ischemia. He denies any chest pain. I will DC lisinopril- Patient has preserved LV systolic function. Can not be on BB due to bradycardia. I will rather add low dose nitrate and norvasc to address microvascular disease issue. I will also DC brilinta and start him on prasugrel.   Pearletha Furl, MD

## 2021-01-20 LAB — CBC WITH AUTO DIFFERENTIAL
Absolute Eos #: 0.07 10*3/uL (ref 0.00–0.44)
Absolute Immature Granulocyte: 0.03 10*3/uL (ref 0.00–0.30)
Absolute Lymph #: 1.91 10*3/uL (ref 1.10–3.70)
Absolute Mono #: 0.84 10*3/uL (ref 0.10–1.20)
Basophils Absolute: 0.03 10*3/uL (ref 0.00–0.20)
Basophils: 0 % (ref 0–2)
Eosinophils %: 1 % (ref 1–4)
Hematocrit: 39.1 % — ABNORMAL LOW (ref 40.7–50.3)
Hemoglobin: 13.2 g/dL (ref 13.0–17.0)
Immature Granulocytes: 0 %
Lymphocytes: 27 % (ref 24–43)
MCH: 29.8 pg (ref 25.2–33.5)
MCHC: 33.8 g/dL (ref 28.4–34.8)
MCV: 88.3 fL (ref 82.6–102.9)
MPV: 11 fL (ref 8.1–13.5)
Monocytes: 12 % (ref 3–12)
NRBC Automated: 0 per 100 WBC
Platelets: 215 10*3/uL (ref 138–453)
RBC: 4.43 m/uL (ref 4.21–5.77)
RDW: 11.9 % (ref 11.8–14.4)
Seg Neutrophils: 60 % (ref 36–65)
Segs Absolute: 4.28 10*3/uL (ref 1.50–8.10)
WBC: 7.2 10*3/uL (ref 3.5–11.3)

## 2021-01-20 LAB — TROPONIN
Troponin, High Sensitivity: 3396 ng/L (ref 0–22)
Troponin, High Sensitivity: 3614 ng/L (ref 0–22)
Troponin, High Sensitivity: 2629 ng/L (ref 0–22)

## 2021-01-20 LAB — EKG 12-LEAD
Atrial Rate: 38 {beats}/min
P Axis: 61 degrees
P-R Interval: 268 ms
Q-T Interval: 480 ms
QRS Duration: 112 ms
QTc Calculation (Bazett): 381 ms
R Axis: -13 degrees
T Axis: 17 degrees
Ventricular Rate: 38 {beats}/min

## 2021-01-20 LAB — BASIC METABOLIC PANEL W/ REFLEX TO MG FOR LOW K
Anion Gap: 11 mmol/L (ref 9–17)
BUN: 19 mg/dL (ref 8–23)
CO2: 21 mmol/L (ref 20–31)
Calcium: 8.6 mg/dL (ref 8.6–10.4)
Chloride: 104 mmol/L (ref 98–107)
Creatinine: 0.89 mg/dL (ref 0.70–1.20)
Est, Glom Filt Rate: >60 mL/min/{1.73_m2} (ref >60)
Glucose: 95 mg/dL (ref 70–99)
Potassium: 4.1 mmol/L (ref 3.7–5.3)
Sodium: 136 mmol/L (ref 135–144)

## 2021-01-20 MED ORDER — ISOSORBIDE MONONITRATE ER 30 MG PO TB24
30 MG | ORAL_TABLET | Freq: Every day | ORAL | 3 refills | Status: AC
Start: 2021-01-20 — End: ?
  Filled 2021-01-20: qty 30, 60d supply, fill #0

## 2021-01-20 MED ORDER — ASPIRIN 81 MG PO CHEW
81 MG | ORAL_TABLET | Freq: Every day | ORAL | 3 refills | Status: AC
Start: 2021-01-20 — End: ?
  Filled 2021-01-20: qty 30, 30d supply, fill #0

## 2021-01-20 MED ORDER — ATORVASTATIN CALCIUM 80 MG PO TABS
80 MG | ORAL_TABLET | Freq: Every evening | ORAL | 3 refills | Status: AC
Start: 2021-01-20 — End: ?
  Filled 2021-01-20: qty 30, 30d supply, fill #0

## 2021-01-20 MED ORDER — FAMOTIDINE 20 MG PO TABS
20 MG | ORAL_TABLET | Freq: Two times a day (BID) | ORAL | 3 refills | Status: AC
Start: 2021-01-20 — End: ?
  Filled 2021-01-20: qty 60, 30d supply, fill #0

## 2021-01-20 MED ORDER — AMLODIPINE BESYLATE 5 MG PO TABS
5 MG | ORAL_TABLET | Freq: Every evening | ORAL | 3 refills | Status: AC
Start: 2021-01-20 — End: ?
  Filled 2021-01-20: qty 30, 30d supply, fill #0

## 2021-01-20 MED ORDER — NITROGLYCERIN 0.4 MG SL SUBL
0.4 MG | ORAL_TABLET | SUBLINGUAL | 3 refills | Status: AC | PRN
Start: 2021-01-20 — End: ?
  Filled 2021-01-20: qty 25, 30d supply, fill #0

## 2021-01-20 MED ORDER — PRASUGREL HCL 10 MG PO TABS
10 MG | ORAL_TABLET | Freq: Every day | ORAL | 3 refills | Status: AC
Start: 2021-01-20 — End: ?
  Filled 2021-01-20: qty 30, 30d supply, fill #0

## 2021-01-20 MED FILL — PRASUGREL HCL 10 MG PO TABS: 10 MG | ORAL | Qty: 1

## 2021-01-20 MED FILL — ISOSORBIDE MONONITRATE ER 30 MG PO TB24: 30 MG | ORAL | Qty: 1

## 2021-01-20 MED FILL — HEPARIN SODIUM (PORCINE) 5000 UNIT/ML IJ SOLN: 5000 UNIT/ML | INTRAMUSCULAR | Qty: 1

## 2021-01-20 MED FILL — FAMOTIDINE 20 MG PO TABS: 20 MG | ORAL | Qty: 1

## 2021-01-20 MED FILL — LIPITOR 80 MG PO TABS: 80 MG | ORAL | Qty: 1

## 2021-01-20 MED FILL — AMLODIPINE BESYLATE 5 MG PO TABS: 5 MG | ORAL | Qty: 1

## 2021-01-20 MED FILL — ASPIRIN LOW STRENGTH 81 MG PO CHEW: 81 MG | ORAL | Qty: 1

## 2021-01-20 NOTE — Plan of Care (Signed)
Problem: Discharge Planning  Goal: Discharge to home or other facility with appropriate resources  Outcome: Completed     Problem: Skin/Tissue Integrity  Goal: Absence of new skin breakdown  Description: 1.  Monitor for areas of redness and/or skin breakdown  2.  Assess vascular access sites hourly  3.  Every 4-6 hours minimum:  Change oxygen saturation probe site  4.  Every 4-6 hours:  If on nasal continuous positive airway pressure, respiratory therapy assess nares and determine need for appliance change or resting period.  Outcome: Completed     Problem: Safety - Adult  Goal: Free from fall injury  Outcome: Completed     Problem: Pain  Goal: Verbalizes/displays adequate comfort level or baseline comfort level  Outcome: Completed

## 2021-01-20 NOTE — Progress Notes (Addendum)
[facility]   Progress Note                   Date:   01/20/2021  Patient name: Tyler Arroyo  Date of admission:  01/17/2021  6:59 PM  MRN:   [id number]  Date of Birth:  01-24-1961  PCP: No primary care provider on file.    Reason for Admission: acs     Subjective:       Seen and examined.  Overnight events noted.  Currently he is chest pain-free.  Repeat cardiac cath yesterday with patent stents.      Intake/Output Summary (Last 24 hours) at 01/20/2021 0751  Last data filed at 01/20/2021 0600  Gross per 24 hour   Intake --   Output 900 ml   Net -900 ml             Medications:   Scheduled Meds:   heparin (porcine)  5,000 Units SubCUTAneous 3 times per day    prasugrel  10 mg Oral Daily    amLODIPine  5 mg Oral Nightly    isosorbide mononitrate  15 mg Oral Daily    sodium chloride flush  5-40 mL IntraVENous 2 times per day    atorvastatin  80 mg Oral Nightly    aspirin  81 mg Oral Daily    sodium chloride flush  5-40 mL IntraVENous 2 times per day    famotidine  20 mg Oral BID     Continuous Infusions:   sodium chloride      sodium chloride      dextrose       CBC:   Recent Labs     01/18/21  0303 01/19/21  0722 01/20/21  0425   WBC 7.6 8.6 7.2   HGB 13.4 13.9 13.2   PLT 180 189 215       BMP:    Recent Labs     01/18/21  0303 01/18/21  1112 01/19/21  0722 01/20/21  0425   NA 138  --  134* 136   K 3.5* 4.0 4.1 4.1   CL 104  --  103 104   CO2 22  --  21 21   BUN 14  --  13 19   CREATININE 0.77  --  0.81 0.89   GLUCOSE 101*  --  104* 95       Hepatic:   Recent Labs     01/18/21  0303   AST 104*   ALT 27   BILITOT 0.7   ALKPHOS 58       Troponin: No results for input(s): TROPONINI in the last 72 hours.  BNP: No results for input(s): BNP in the last 72 hours.  Lipids:   Recent Labs     01/18/21  0303   CHOL 184   HDL 67       INR: No results for input(s): INR in the last 72 hours.    Objective:   Vitals: BP 105/65    Pulse (!) 42    Temp 98.2 ??F (36.8 ??C) (Oral)    Resp 18    Ht 6\' 2"  (1.88 m)    Wt  216 lb 14.9 oz (98.4 kg)    SpO2 97%    BMI 27.85 kg/m??   General appearance: alert and cooperative with exam  HEENT: Head: Normocephalic, no lesions, without obvious abnormality.  Neck: no adenopathy, no carotid bruit, no JVD, supple, symmetrical, trachea midline and thyroid  not enlarged, symmetric, no tenderness/mass/nodules  Lungs: clear to auscultation bilaterally  Heart: regular rate and rhythm, S1, S2 normal, no murmur, click, rub or gallop  Abdomen: soft, non-tender; bowel sounds normal; no masses,  no organomegaly  Extremities: extremities normal, atraumatic, no cyanosis or edema  Neurologic: Mental status: Alert, oriented, thought content appropriate        ECHO: 01/19/2021  Normal LV size, mildly increased LV wall thickness  Normal LV wall motions  Normal LV systolic function. EF > 55%  RV appears mildly dilated, normal function  LA appears mildly dilated  AV is tricuspid opens well, mild AR  No significant pericardial effusion seen  IVC appears dilated, normal inspiratory collapse      Cardiac Angiography: 01/17/2021  LMCA: Normal 0% stenosis.     LAD: Mid 80% stenosis with an ulcerated plaque, reduced to 0% with PTCA/DES   Apical LAD is small caliber with diffuse 50-60% stenosis   Diagonals are small and patent      RCA: Mild irregularities 10-20%.Large and dominant   RPL is large with proximal 75% eccentric stenosis, reduced to 0% with DES.   RPDA is large with mid 30-40% stenosis     Ramus: Mild irregularities 10-20%.Large caliber vessel.      The LV gram was performed in the RAO 30 position.    LVEF: 55%. LV Wall Motion: normal    Cardiac Angiography: 01/19/2021  LMCA: Normal 0% stenosis.     LAD: Patent stented area in D     LCx: Normal 0% stenosis.Distal small branch with 75% stenosis     RCA: Large   Patent distal stent      Coronary Tree        Dominance: Right       LV Analysis   LV function assessed [id number].   Ejection Fraction    +----------------------------------------------------------------------+---+   !Method                                                                !EF%!   +----------------------------------------------------------------------+---+   !LV gram                                                               !55 !   +----------------------------------------------------------------------+---+           IMPRESSION:    NSTEMI type I with urgent cardiac cath 11/13 status post PCI/DES to mid LAD and proximal RPL.  Preserved LVEF on last echocardiogram.  Hypertensive emergency   Sinus bradycardia, chronic (patient is a marathon runner).     RECOMMENDATIONS:  Continue postop medical therapy.  Continue aspirin, statin, started on Prasugrel yesterday due to bradycardia with Brilinta, amlodipine.  Imdur added yesterday.  Nitro as needed.  PT OT.  Incentive spirometry.  Likely discharge today in the p.m. if patient remains stable with no chest pain.    Lauralee Evener, MD       Cardiovascular Fellow      Attending Physician Statement  I have discussed the care of Tyler Arroyo, including pertinent history and  exam findings,  with the cardiology fellow/resident. I have seen and examined the patient and the key elements of all parts of the encounter have been performed by me. I have completed at least one if not all key elements of the E/M (history, physical exam, and MDM). I agree with the assessment, plan and orders as documented by the resident with additional recommendations as below:       Feeling much better, no chest pain or dyspnea, has been ambulating in hallway, bp and hr stable, desperate to go home and does not want to wait for another day. Will  start d/c planning if repeat troponin trending down. Asa, effient, statin and imdur. Patient to establish care with cardiology at [facility].     Heminder meet Thedore Mins MD, [facility], [facility]

## 2021-01-20 NOTE — Discharge Summary (Signed)
Tyler Arroyo - [facility]    Discharge Summary     Patient ID: Tyler Arroyo  DOB:  [birth year]   MRN: [id number]     ACCOUNT:  [id number]   Patient's PCP: No primary care provider on file.  Admit Date: 01/17/2021   Discharge Date: 01/20/2021     Length of Stay: 3  Code Status:  Full Code  Admitting Physician: No admitting provider for patient encounter.  Discharge Physician: Tanja Port, MD     Active Discharge Diagnoses:     Hospital Problem Lists:  Principal Problem:    NSTEMI (non-ST elevated myocardial infarction) [facility])  Resolved Problems:    * No resolved hospital problems. *      Admission Condition:  serious     Discharged Condition: good    Hospital Stay:     Hospital Course:  Tyler Arroyo is a 60 y.o. male who was admitted for the management of   NSTEMI (non-ST elevated myocardial infarction) [facility]) , presented to ER with hypertension and Chest Pain          Significant therapeutic interventions: cardiac cath 11/13 status post PCI/DES to mid LAD and proximal RPL  Repeat cardiac cath on 11/15 : showed patent stents     Significant Diagnostic Studies:   Labs / Micro:  CBC:   Lab Results   Component Value Date/Time    WBC 7.2 01/20/2021 04:25 AM    RBC 4.43 01/20/2021 04:25 AM    HGB 13.2 01/20/2021 04:25 AM    HCT 39.1 01/20/2021 04:25 AM    MCV 88.3 01/20/2021 04:25 AM    MCH 29.8 01/20/2021 04:25 AM    MCHC 33.8 01/20/2021 04:25 AM    RDW 11.9 01/20/2021 04:25 AM    PLT 215 01/20/2021 04:25 AM     BMP:    Lab Results   Component Value Date/Time    GLUCOSE 95 01/20/2021 04:25 AM    NA 136 01/20/2021 04:25 AM    K 4.1 01/20/2021 04:25 AM    CL 104 01/20/2021 04:25 AM    CO2 21 01/20/2021 04:25 AM    ANIONGAP 11 01/20/2021 04:25 AM    BUN 19 01/20/2021 04:25 AM    CREATININE 0.89 01/20/2021 04:25 AM    CALCIUM 8.6 01/20/2021 04:25 AM    LABGLOM >60 01/20/2021 04:25 AM        Radiology:  XR CHEST PORTABLE    Result Date: 01/17/2021  1. No significant change.     XR CHEST  PORTABLE    Result Date: 01/17/2021  1.  No acute abnormality.       Consultations:    Consults:     Final Specialist Recommendations/Findings:   IP CONSULT TO CRITICAL CARE  IP CONSULT TO HOSPITALIST  IP CONSULT TO CARDIAC REHAB  IP CONSULT TO CARDIAC REHAB  IP CONSULT TO CARDIOLOGY      The patient was seen and examined on day of discharge and this discharge summary is in conjunction with any daily progress note from day of discharge.    Discharge plan:     Disposition: Home    Physician Follow Up:   Cardiology and pcp   No follow-up provider specified.     Requiring Further Evaluation/Follow Up POST HOSPITALIZATION/Incidental Findings: bradycardia, first degree a.v block     Diet: cardiac diet    Activity: As tolerated    Instructions to Patient: f/u with cardiology and consider cardiac rehab in 1 month  before resuming full physical activity    Discharge Medications:      Medication List      You have not been prescribed any medications.         Time Spent on discharge is  20 mins in patient examination, evaluation, counseling as well as medication reconciliation, prescriptions for required medications, discharge plan and follow up.    Electronically signed by     Tanja Port, MD FASN  Attending Physician, [facility]   Faculty, Internal Medicine Residency Program  [facility] Phys[facility] - [facility]  01/20/2021, 2:40 PM  Thank you Dr. No primary care provider on file. for the opportunity to be involved in this patient's care.

## 2021-01-20 NOTE — Progress Notes (Signed)
CLINICAL PHARMACY NOTE: MEDS TO BEDS    Total # of Prescriptions Filled: 7   The following medications were delivered to the patient:  ISOSORBIDE ER 30   ASA CHEW   LIPITOR 80  NITRO   NORVASC 10   PEPCID 20     Additional Documentation:

## 2021-01-20 NOTE — Progress Notes (Signed)
CLINICAL PHARMACY NOTE: MEDS TO BEDS    Total # of Prescriptions Filled: 7   The following medications were delivered to the patient:  Isosorbide  Amlodipine  Aspirin  Atorvastatin  Nitrostat  Prasugrel  famotidine    Additional Documentation:   $46.57 collected - credit card

## 2021-01-21 ENCOUNTER — Encounter: Payer: Self-pay | Admitting: Internal Medicine

## 2021-01-22 NOTE — Telephone Encounter (Signed)
LMTCB  After talking with the [facility] pt can see Dr. Johney Frame this Monday 01/25/21 however noted he has an upcoming appt with Dr. Einar Gip 01/25/21 as well.

## 2021-01-22 NOTE — Telephone Encounter (Signed)
Spoke with the pt and he plans to keep his appt with Dr. Einar Gip for Monday 01/25/21... I will cancel his appt for 02/11/21 with Dr. Lovena Le but he will call back if he needs anything else that we can help him with.

## 2021-01-25 ENCOUNTER — Encounter: Payer: Self-pay | Admitting: Cardiology

## 2021-01-25 ENCOUNTER — Ambulatory Visit: Payer: BC Managed Care – PPO | Admitting: Cardiology

## 2021-01-25 ENCOUNTER — Other Ambulatory Visit: Payer: Self-pay

## 2021-01-25 VITALS — BP 146/91 | HR 42 | Temp 98.7°F | Resp 16 | Ht 74.0 in | Wt 217.4 lb

## 2021-01-25 DIAGNOSIS — I10 Essential (primary) hypertension: Secondary | ICD-10-CM | POA: Diagnosis not present

## 2021-01-25 DIAGNOSIS — R001 Bradycardia, unspecified: Secondary | ICD-10-CM

## 2021-01-25 DIAGNOSIS — I252 Old myocardial infarction: Secondary | ICD-10-CM

## 2021-01-25 DIAGNOSIS — I25118 Atherosclerotic heart disease of native coronary artery with other forms of angina pectoris: Secondary | ICD-10-CM | POA: Diagnosis not present

## 2021-01-25 MED ORDER — LOSARTAN POTASSIUM 50 MG PO TABS: 50.0000 mg | ORAL_TABLET | Freq: Every evening | ORAL | 2 refills | Status: DC

## 2021-01-25 MED ORDER — PRASUGREL HCL 10 MG PO TABS: 10.0000 mg | ORAL_TABLET | Freq: Every day | ORAL | 3 refills | Status: DC

## 2021-01-25 MED ORDER — AMLODIPINE BESYLATE 5 MG PO TABS: 5.0000 mg | ORAL_TABLET | Freq: Every day | ORAL | 1 refills | Status: DC

## 2021-01-25 NOTE — Progress Notes (Signed)
Primary Physician/Referring:  Sueanne Margarita, DO  Patient ID: Brandon Short, male    DOB: 1960-07-12, 60 y.o.   MRN: [id number]  Chief Complaint  Patient presents with   Code STEMI   Bradycardia   New Patient (Initial Visit)   HPI:    Brandon Short  is a 60 y.o. Caucasian male patient with history of asymptomatic sinus node dysfunction, marked sinus bradycardia, who exercises fairly regularly and runs at least between 30 to 35 miles a week, history of elevated blood pressure felt to be secondary to marked sinus bradycardia, no family history of premature coronary disease, no hyperlipidemia, who completed half a mile without on 01/16/2021, started noticing jaw pain and teeth pain while doing the marathon.  Felt felt later, traveled to [place] the same day to move his daughter into an apartment.  He was not feeling well and presented to urgent care where he was found to be markedly hypertensive, was transferred to the emergency department where STEMI was activated and taken emergently to the Cath Lab and underwent stenting to the LAD and circumflex coronary artery on 01/17/2021, discharged 3 days later with repeat catheterization revealing patent stents perform due to chest pain and also continued elevation in troponins.  He now presents to establish care and except for headache from nitroglycerin, accompanied by his wife.  Remains asymptomatic.  Past Medical History:  Diagnosis Date   Heart attack ([place]) 01/16/2021   After running a 13 mile marathon   Hyperlipidemia    Hypertension    Past Surgical History:  Procedure Laterality Date   APPENDECTOMY  2005   Family History  Problem Relation Age of Onset   Cancer - Lung Mother    Prostate cancer Father    Dementia Father    Crohn's disease Sister     Social History   Tobacco Use   Smoking status: Never   Smokeless tobacco: Never  Substance Use Topics   Alcohol use: No   Marital Status: Single  ROS  Review of Systems   Cardiovascular:  Negative for chest pain, dyspnea on exertion and leg swelling.  Gastrointestinal:  Negative for melena.  Objective  Blood pressure (!) 146/91, pulse (!) 42, temperature 98.7 F (37.1 C), temperature source Temporal, resp. rate 16, height $RemoveBe'6\' 2"'uISDoaVoy$  (1.88 m), weight 217 lb 6.4 oz (98.6 kg), SpO2 98 %. Body mass index is 27.91 kg/m.  Vitals with BMI 01/25/2021 01/25/2021 12/06/2019  Height - $Remove'6\' 2"'lHudpNP$  $RemoveB'6\' 2"'wJnMzACE$   Weight - 217 lbs 6 oz 207 lbs  BMI - 10.1 75.10  Systolic 258 527 782  Diastolic 91 81 80  Pulse 42 39 29    Physical Exam Neck:     Vascular: No carotid bruit or JVD.  Cardiovascular:     Rate and Rhythm: Regular rhythm. Bradycardia present.     Pulses: Intact distal pulses.     Heart sounds: Murmur heard.  Early systolic murmur is present with a grade of 2/6 at the upper right sternal border.    No gallop.  Pulmonary:     Effort: Pulmonary effort is normal.     Breath sounds: Normal breath sounds.  Abdominal:     General: Bowel sounds are normal.     Palpations: Abdomen is soft.  Musculoskeletal:        General: No swelling.     Laboratory examination:   No results for input(s): NA, K, CL, CO2, GLUCOSE, BUN, CREATININE, CALCIUM, GFRNONAA, GFRAA in the last 8760 hours.  CrCl cannot be calculated (No successful lab value found.).  No flowsheet data found. No flowsheet data found.  Lipid Panel No results for input(s): CHOL, TRIG, LDLCALC, VLDL, HDL, CHOLHDL, LDLDIRECT in the last 8760 hours. Lipid Panel  No results found for: CHOL, TRIG, HDL, CHOLHDL, VLDL, LDLCALC, LDLDIRECT, LABVLDL   HEMOGLOBIN A1C No results found for: HGBA1C, MPG TSH No results for input(s): TSH in the last 8760 hours.  External labs:   Labs 01/18/2021: Total cholesterol 184, triglycerides 114, HDL 67, LDL 96.  Serum glucose 95 mg, BUN 19, creatinine 0.89, EGFR >60 mL, potassium 4.1.  ALT 27, AST 114.  Peak serum troponin 3614.  Hb 13.2/HCT 39.1, platelets 215.  A1C 4.900 %  12/17/2020 TSH 1.130 12/17/2020  Medications and allergies  No Known Allergies   Medication prior to this encounter:   Outpatient Medications Prior to Visit  Medication Sig Dispense Refill   aspirin 81 MG chewable tablet Chew 1 tablet by mouth daily.     atorvastatin (LIPITOR) 80 MG tablet Take 80 mg by mouth daily.     nitroGLYCERIN (NITROSTAT) 0.4 MG SL tablet Place 1 tablet under the tongue as needed for chest pain.     amLODipine (NORVASC) 5 MG tablet Take 5 mg by mouth daily.     famotidine (PEPCID) 20 MG tablet Take 20 mg by mouth 2 (two) times daily.     isosorbide mononitrate (IMDUR) 30 MG 24 hr tablet Take 15 mg by mouth daily.     prasugrel (EFFIENT) 10 MG TABS tablet Take 10 mg by mouth daily.     No facility-administered medications prior to visit.     Medication list after today's encounter   Current Outpatient Medications  Medication Instructions   amLODipine (NORVASC) 5 mg, Oral, Daily   aspirin 81 MG chewable tablet 1 tablet, Oral, Daily   atorvastatin (LIPITOR) 80 mg, Oral, Daily   losartan (COZAAR) 50 mg, Oral, Every evening   nitroGLYCERIN (NITROSTAT) 0.4 MG SL tablet 1 tablet, Sublingual, As needed   prasugrel (EFFIENT) 10 mg, Oral, Daily    Radiology:   Chest x-ray PA and lateral view 01/17/2021: The lungs are clear.  The cardiac silhouette is stable.  There is no  pneumothorax or pleural effusion.  Cardiac Studies:   Coronary Angiogram 01/17/2021 patent stents 01/19/2021: LMCA: Normal 0% stenosis.  LAD: Mid 80% stenosis with an ulcerated plaque, reduced to 0% with PTCA/DES with Resolute Onyx 3.0 x 18 DES.Marland Kitchen Apical LAD is small caliber with diffuse 50-60% stenosis  Diagonals are small and patent   LCx: Mild irregularities 20-30%.OM 1 is small and patent  OM 2 has very distal 80% stenosis (small caliber vessel)  RCA: Mild irregularities 10-20%.Large and dominant  RPL is large with proximal 75% eccentric stenosis, reduced to 0% with Resolute Onyx 3.0 x  15 DES. RPDA is large with mid 30-40% stenosis.  Echocardiogram 01/19/2021: Normal LV size, mildly increased LV wall thickness. Normal LV wall motions. Normal LV systolic function. EF > 55%  RV appears mildly dilated, normal function  LA appears mildly dilated  AV is tricuspid opens well, mild AR  No significant pericardial effusion seen  IVC appears dilated, normal inspiratory collapse   EKG:   EKG 01/25/2021: Marked sinus bradycardia at rate of 39 bpm with first-degree AV block, leftward axis, anteroseptal infarct old, there is persistent ST elevation in V2 to V4.  Compared to 01/06/2020, no significant change, previously ventricular rate was 29 bpm.  ST elevation in V2  through V4 not present, previously early repolarization was evident.  Assessment     ICD-10-CM   1. Coronary artery disease involving native coronary artery of native heart with other form of angina pectoris (HCC)  I25.118 EKG 12-Lead    CBC    CMP14+EGFR    Lipoprotein A (LPA)    Lipid Panel With LDL/HDL Ratio    Apo A1 + B + Ratio    AMB referral to cardiac rehabilitation    2. Bradycardia  R00.1     3. History of ST elevation myocardial infarction (STEMI) 01/17/2021  I25.2 AMB referral to cardiac rehabilitation    4. Primary hypertension  I10        Medications Discontinued During This Encounter  Medication Reason   isosorbide mononitrate (IMDUR) 30 MG 24 hr tablet Change in therapy   prasugrel (EFFIENT) 10 MG TABS tablet Reorder   amLODipine (NORVASC) 5 MG tablet Reorder   famotidine (PEPCID) 20 MG tablet No longer needed (for PRN medications)    Meds ordered this encounter  Medications   prasugrel (EFFIENT) 10 MG TABS tablet    Sig: Take 1 tablet (10 mg total) by mouth daily.    Dispense:  90 tablet    Refill:  3   amLODipine (NORVASC) 5 MG tablet    Sig: Take 1 tablet (5 mg total) by mouth daily.    Dispense:  90 tablet    Refill:  1   losartan (COZAAR) 50 MG tablet    Sig: Take 1 tablet (50  mg total) by mouth every evening.    Dispense:  30 tablet    Refill:  2    Orders Placed This Encounter  Procedures   CBC   CMP14+EGFR   Lipoprotein A (LPA)   Lipid Panel With LDL/HDL Ratio   Apo A1 + B + Ratio   AMB referral to cardiac rehabilitation    Referral Priority:   Routine    Referral Type:   Consultation    Number of Visits Requested:   1   EKG 12-Lead   Recommendations:   Brandon Short is a 60 y.o. Caucasian male patient with history of asymptomatic sinus node dysfunction, marked sinus bradycardia, who exercises fairly regularly and runs at least between 30 to 35 miles a week, history of elevated blood pressure felt to be secondary to marked sinus bradycardia, no family history of premature coronary disease, no hyperlipidemia, who completed half a mile without on 01/16/2021, started noticing jaw pain and teeth pain along with retrosternal chest pain while doing the marathon.  He traveled to [place], presented with hypertensive urgency to the emergency room and was also found to have ST elevation in the anterolateral leads and underwent emergent cardiac catheterization and stenting to LAD and RCA.  He now presents to establish care and except for headache from nitroglycerin, accompanied by his wife.  I have reviewed the coronary angiograms, patient had an ulcerated mid LAD stenosis, surprisingly in spite of no significant cardiovascular risks, he has had significant diffuse disease in the coronary vessels.  For now continue high intensity statins along with amlodipine both for angina pectoris and hypertension.  I will discontinue isosorbide mononitrate and start him on losartan 50 mg in the evening.  Will obtain lipids along with LPA and apolipoprotein B to A ratio as well along with a CBC and a CMP in 3 weeks.  Although no murmur was documented in the clinical notes from [place], he does have a systolic  ejection murmur and patient states that he has had this for many years, suspect  flow murmur as he has got bradycardia.  Referral made for cardiac rehab.  He may return to work next week on 02/01/2021 without restriction.  Peak troponin was only in the 3000 range and he has normal LVEF.  I would like to see him back in 2 months for follow-up.  This was a >60-minute office visit encounter and evaluation of external records, review of coronary angiograms personally.   Adrian Prows, MD, [facility] 01/25/2021, 5:45 PM Office: [phone]

## 2021-01-26 NOTE — Telephone Encounter (Signed)
From pt

## 2021-02-02 ENCOUNTER — Telehealth (HOSPITAL_COMMUNITY): Payer: Self-pay

## 2021-02-02 NOTE — Telephone Encounter (Signed)
Pt called wanting to scheduling for cardiac rehab, I advised pt that he has been cleared and that we have a backlog of 1-3 months and that we would give him a call at a later date to schedule. Pt stated to close his referral and that he wasn't going to need rehab by then. Closed referral

## 2021-02-04 HISTORY — PX: KIDNEY STONE SURGERY: SHX686

## 2021-02-11 ENCOUNTER — Ambulatory Visit: Payer: 59 | Admitting: Internal Medicine

## 2021-02-11 DIAGNOSIS — I25118 Atherosclerotic heart disease of native coronary artery with other forms of angina pectoris: Secondary | ICD-10-CM | POA: Diagnosis not present

## 2021-02-12 LAB — CMP14+EGFR
ALT: 18 IU/L (ref 0–44)
AST: 23 IU/L (ref 0–40)
Albumin/Globulin Ratio: 2.9 — ABNORMAL HIGH (ref 1.2–2.2)
Albumin: 4.6 g/dL (ref 3.8–4.9)
Alkaline Phosphatase: 85 IU/L (ref 44–121)
BUN/Creatinine Ratio: 17 (ref 10–24)
BUN: 19 mg/dL (ref 8–27)
Bilirubin Total: 0.8 mg/dL (ref 0.0–1.2)
CO2: 22 mmol/L (ref 20–29)
Calcium: 9.1 mg/dL (ref 8.6–10.2)
Chloride: 108 mmol/L — ABNORMAL HIGH (ref 96–106)
Creatinine, Ser: 1.1 mg/dL (ref 0.76–1.27)
Globulin, Total: 1.6 g/dL (ref 1.5–4.5)
Glucose: 90 mg/dL (ref 70–99)
Potassium: 4.6 mmol/L (ref 3.5–5.2)
Sodium: 145 mmol/L — ABNORMAL HIGH (ref 134–144)
Total Protein: 6.2 g/dL (ref 6.0–8.5)
eGFR: 77 mL/min/{1.73_m2} (ref >59)

## 2021-02-12 LAB — CBC
Hematocrit: 45.3 % (ref 37.5–51.0)
Hemoglobin: 15.6 g/dL (ref 13.0–17.7)
MCH: 29.2 pg (ref 26.6–33.0)
MCHC: 34.4 g/dL (ref 31.5–35.7)
MCV: 85 fL (ref 79–97)
Platelets: 233 10*3/uL (ref 150–450)
RBC: 5.35 x10E6/uL (ref 4.14–5.80)
RDW: 12 % (ref 11.6–15.4)
WBC: 6.2 10*3/uL (ref 3.4–10.8)

## 2021-02-12 LAB — LIPID PANEL WITH LDL/HDL RATIO
Cholesterol, Total: 148 mg/dL (ref 100–199)
HDL: 65 mg/dL (ref >39)
LDL Chol Calc (NIH): 70 mg/dL (ref 0–99)
LDL/HDL Ratio: 1.1 ratio (ref 0.0–3.6)
Triglycerides: 64 mg/dL (ref 0–149)
VLDL Cholesterol Cal: 13 mg/dL (ref 5–40)

## 2021-02-12 LAB — LIPOPROTEIN A (LPA): Lipoprotein (a): 134 nmol/L — ABNORMAL HIGH (ref <75.0)

## 2021-02-12 LAB — APO A1 + B + RATIO
Apolipo. B/A-1 Ratio: 0.4 ratio (ref 0.0–0.7)
Apolipoprotein A-1: 170 mg/dL (ref 101–178)
Apolipoprotein B: 64 mg/dL (ref <90)

## 2021-02-12 NOTE — Addendum Note (Signed)
Addended by: Kela Millin on: 02/12/2021 03:30 PM   Modules accepted: Orders

## 2021-02-13 ENCOUNTER — Encounter: Payer: Self-pay | Admitting: Cardiology

## 2021-02-15 NOTE — Telephone Encounter (Signed)
From pt

## 2021-02-16 DIAGNOSIS — I252 Old myocardial infarction: Secondary | ICD-10-CM | POA: Diagnosis not present

## 2021-02-16 DIAGNOSIS — I25118 Atherosclerotic heart disease of native coronary artery with other forms of angina pectoris: Secondary | ICD-10-CM | POA: Diagnosis not present

## 2021-02-17 LAB — HIGH SENSITIVITY CRP: CRP, High Sensitivity: 0.42 mg/L (ref 0.00–3.00)

## 2021-03-03 ENCOUNTER — Emergency Department (HOSPITAL_BASED_OUTPATIENT_CLINIC_OR_DEPARTMENT_OTHER): Payer: BC Managed Care – PPO

## 2021-03-03 ENCOUNTER — Other Ambulatory Visit: Payer: Self-pay

## 2021-03-03 ENCOUNTER — Encounter (HOSPITAL_BASED_OUTPATIENT_CLINIC_OR_DEPARTMENT_OTHER): Payer: Self-pay | Admitting: Emergency Medicine

## 2021-03-03 DIAGNOSIS — R109 Unspecified abdominal pain: Secondary | ICD-10-CM | POA: Diagnosis not present

## 2021-03-03 DIAGNOSIS — Z7982 Long term (current) use of aspirin: Secondary | ICD-10-CM | POA: Insufficient documentation

## 2021-03-03 DIAGNOSIS — N200 Calculus of kidney: Secondary | ICD-10-CM | POA: Diagnosis not present

## 2021-03-03 DIAGNOSIS — R748 Abnormal levels of other serum enzymes: Secondary | ICD-10-CM | POA: Insufficient documentation

## 2021-03-03 DIAGNOSIS — I10 Essential (primary) hypertension: Secondary | ICD-10-CM | POA: Insufficient documentation

## 2021-03-03 DIAGNOSIS — I700 Atherosclerosis of aorta: Secondary | ICD-10-CM | POA: Diagnosis not present

## 2021-03-03 DIAGNOSIS — Z79899 Other long term (current) drug therapy: Secondary | ICD-10-CM | POA: Insufficient documentation

## 2021-03-03 DIAGNOSIS — N23 Unspecified renal colic: Secondary | ICD-10-CM | POA: Diagnosis not present

## 2021-03-03 DIAGNOSIS — N201 Calculus of ureter: Secondary | ICD-10-CM | POA: Diagnosis not present

## 2021-03-03 LAB — CBC
HCT: 42.8 % (ref 39.0–52.0)
Hemoglobin: 14.9 g/dL (ref 13.0–17.0)
MCH: 29 pg (ref 26.0–34.0)
MCHC: 34.8 g/dL (ref 30.0–36.0)
MCV: 83.3 fL (ref 80.0–100.0)
Platelets: 235 10*3/uL (ref 150–400)
RBC: 5.14 MIL/uL (ref 4.22–5.81)
RDW: 12.2 % (ref 11.5–15.5)
WBC: 10.3 10*3/uL (ref 4.0–10.5)
nRBC: 0 % (ref 0.0–0.2)

## 2021-03-03 LAB — COMPREHENSIVE METABOLIC PANEL
ALT: 32 U/L (ref 0–44)
AST: 27 U/L (ref 15–41)
Albumin: 4.4 g/dL (ref 3.5–5.0)
Alkaline Phosphatase: 69 U/L (ref 38–126)
Anion gap: 10 (ref 5–15)
BUN: 31 mg/dL — ABNORMAL HIGH (ref 6–20)
CO2: 24 mmol/L (ref 22–32)
Calcium: 9.3 mg/dL (ref 8.9–10.3)
Chloride: 108 mmol/L (ref 98–111)
Creatinine, Ser: 1.4 mg/dL — ABNORMAL HIGH (ref 0.61–1.24)
GFR, Estimated: 58 mL/min — ABNORMAL LOW (ref >60)
Glucose, Bld: 112 mg/dL — ABNORMAL HIGH (ref 70–99)
Potassium: 4 mmol/L (ref 3.5–5.1)
Sodium: 142 mmol/L (ref 135–145)
Total Bilirubin: 0.7 mg/dL (ref 0.3–1.2)
Total Protein: 6.6 g/dL (ref 6.5–8.1)

## 2021-03-03 LAB — LIPASE, BLOOD: Lipase: 253 U/L — ABNORMAL HIGH (ref 11–51)

## 2021-03-03 MED ORDER — ONDANSETRON HCL 4 MG/2ML IJ SOLN
4.0000 mg | Freq: Once | INTRAMUSCULAR | Status: AC | PRN
  Administered 2021-03-03: 21:00:00 4 mg via INTRAVENOUS

## 2021-03-03 MED ORDER — ONDANSETRON HCL 4 MG/2ML IJ SOLN
INTRAMUSCULAR | Status: AC
  Filled 2021-03-03: qty 2

## 2021-03-03 NOTE — ED Triage Notes (Signed)
°  Patient comes in with L flank pain and N/V that started earlier today.  Patient states he has had several episodes of emesis and has hematuria.  No hx of kidney stones.  Patient states he had MI and two stents placed last month but denies any symptoms associated with chest.  Pain 10/10, sharp/stabbing.

## 2021-03-04 ENCOUNTER — Emergency Department (HOSPITAL_BASED_OUTPATIENT_CLINIC_OR_DEPARTMENT_OTHER)
Admission: EM | Admit: 2021-03-04 | Discharge: 2021-03-04 | Disposition: A | Discharge status: Home / Self Care | Payer: BC Managed Care – PPO | Attending: Emergency Medicine | Admitting: Emergency Medicine

## 2021-03-04 DIAGNOSIS — N23 Unspecified renal colic: Secondary | ICD-10-CM

## 2021-03-04 DIAGNOSIS — N201 Calculus of ureter: Secondary | ICD-10-CM

## 2021-03-04 DIAGNOSIS — R748 Abnormal levels of other serum enzymes: Secondary | ICD-10-CM

## 2021-03-04 LAB — URINALYSIS, ROUTINE W REFLEX MICROSCOPIC
Bilirubin Urine: NEGATIVE
Glucose, UA: NEGATIVE mg/dL
Leukocytes,Ua: NEGATIVE
Nitrite: NEGATIVE
RBC / HPF: >50 RBC/hpf — ABNORMAL HIGH (ref 0–5)
Specific Gravity, Urine: 1.022 (ref 1.005–1.030)
pH: 6 (ref 5.0–8.0)

## 2021-03-04 MED ORDER — KETOROLAC TROMETHAMINE 30 MG/ML IJ SOLN
30.0000 mg | Freq: Once | INTRAMUSCULAR | Status: AC
  Administered 2021-03-04: 01:00:00 30 mg via INTRAVENOUS
  Filled 2021-03-04: qty 1

## 2021-03-04 MED ORDER — HYDROCODONE-ACETAMINOPHEN 5-325 MG PO TABS: 1.0000 | ORAL_TABLET | ORAL | 0 refills | Status: DC | PRN

## 2021-03-04 MED ORDER — TAMSULOSIN HCL 0.4 MG PO CAPS: 0.4000 mg | ORAL_CAPSULE | Freq: Every day | ORAL | 0 refills | Status: DC

## 2021-03-04 MED ORDER — HYDROCODONE-ACETAMINOPHEN 5-325 MG PO TABS
1.0000 | ORAL_TABLET | Freq: Once | ORAL | Status: AC
  Administered 2021-03-04: 03:00:00 1 via ORAL
  Filled 2021-03-04: qty 1

## 2021-03-04 MED ORDER — ONDANSETRON HCL 4 MG PO TABS: 4.0000 mg | ORAL_TABLET | Freq: Four times a day (QID) | ORAL | 0 refills | Status: DC | PRN

## 2021-03-04 NOTE — ED Notes (Signed)
Family at bedside. 

## 2021-03-04 NOTE — ED Provider Notes (Signed)
[facility] DEPT Provider Note   CSN: [id number] Arrival date & time: 03/03/21  1944     History Chief Complaint  Patient presents with   Flank Pain   Emesis    Brandon Short is a 61 y.o. male.  The history is provided by the patient.  Flank Pain  Emesis He has history of hypertension, hyperlipidemia, coronary artery disease and comes in with left flank pain which started this afternoon and got worse tonight.  He had received a dose of ketorolac after arriving in the ED, and he is currently pain-free.  There was associated nausea and vomiting.  He denies fever, chills, sweats.  He did not notice any blood in his urine.  He denies history of kidney stones.  When pain was present, nothing made it better and nothing made it worse.   Past Medical History:  Diagnosis Date   Heart attack ([place]) 01/16/2021   After running a 13 mile marathon   Hyperlipidemia    Hypertension     Patient Active Problem List   Diagnosis Date Noted   Bradycardia 12/06/2019    Past Surgical History:  Procedure Laterality Date   APPENDECTOMY  2005       Family History  Problem Relation Age of Onset   Cancer - Lung Mother    Prostate cancer Father    Dementia Father    Crohn's disease Sister     Social History   Tobacco Use   Smoking status: Never   Smokeless tobacco: Never  Substance Use Topics   Alcohol use: No   Drug use: No    Home Medications Prior to Admission medications   Medication Sig Start Date End Date Taking? Authorizing Provider  amLODipine (NORVASC) 5 MG tablet Take 1 tablet (5 mg total) by mouth daily. 01/25/21   Adrian Prows, MD  aspirin 81 MG chewable tablet Chew 1 tablet by mouth daily. 01/21/21   [provider]  atorvastatin (LIPITOR) 80 MG tablet Take 80 mg by mouth daily. 01/20/21   [provider]  losartan (COZAAR) 50 MG tablet Take 1 tablet (50 mg total) by mouth every evening. 01/25/21 04/25/21  Adrian Prows, MD   nitroGLYCERIN (NITROSTAT) 0.4 MG SL tablet Place 1 tablet under the tongue as needed for chest pain. 01/20/21   [provider]  prasugrel (EFFIENT) 10 MG TABS tablet Take 1 tablet (10 mg total) by mouth daily. 01/25/21   Adrian Prows, MD    Allergies    Patient has no known allergies.  Review of Systems   Review of Systems  Gastrointestinal:  Positive for vomiting.  Genitourinary:  Positive for flank pain.  All other systems reviewed and are negative.  Physical Exam Updated Vital Signs BP 139/76 (BP Location: Left Arm)    Pulse (!) 50    Temp 97.9 F (36.6 C) (Oral)    Resp 16    Ht 6\' 2"  (1.88 m)    Wt 95.3 kg    SpO2 97%    BMI 26.96 kg/m   Physical Exam Vitals and nursing note reviewed.  61 year old male, resting comfortably and in no acute distress. Vital signs are significant for slow heart rate. Oxygen saturation is 97%, which is normal. Head is normocephalic and atraumatic. PERRLA, EOMI. Oropharynx is clear. Neck is nontender and supple without adenopathy or JVD. Back is nontender and there is no CVA tenderness. Lungs are clear without rales, wheezes, or rhonchi. Chest is nontender. Heart has regular  rate and rhythm without murmur. Abdomen is soft, flat, nontender. Extremities have no cyanosis or edema, full range of motion is present. Skin is warm and dry without rash. Neurologic: Mental status is normal, cranial nerves are intact, moves all extremities equally.  ED Results / Procedures / Treatments   Labs (all labs ordered are listed, but only abnormal results are displayed) Labs Reviewed  LIPASE, BLOOD - Abnormal; Notable for the following components:      Result Value   Lipase 253 (*)    All other components within normal limits  COMPREHENSIVE METABOLIC PANEL - Abnormal; Notable for the following components:   Glucose, Bld 112 (*)    BUN 31 (*)    Creatinine, Ser 1.40 (*)    GFR, Estimated 58 (*)    All other components within normal limits   URINALYSIS, ROUTINE W REFLEX MICROSCOPIC - Abnormal; Notable for the following components:   APPearance HAZY (*)    Hgb urine dipstick LARGE (*)    Ketones, ur TRACE (*)    Protein, ur TRACE (*)    RBC / HPF >50 (*)    All other components within normal limits  CBC   Radiology CT Renal Stone Study  Result Date: 03/03/2021 CLINICAL DATA:  Left flank pain, nausea, and vomiting. Hematuria. Kidney stones suspected. EXAM: CT ABDOMEN AND PELVIS WITHOUT CONTRAST TECHNIQUE: Multidetector CT imaging of the abdomen and pelvis was performed following the standard protocol without IV contrast. COMPARISON:  07/25/2019 FINDINGS: Lower chest: Lung bases are clear. Hepatobiliary: No focal liver abnormality is seen. No gallstones, gallbladder wall thickening, or biliary dilatation. Pancreas: Unremarkable. No pancreatic ductal dilatation or surrounding inflammatory changes. Spleen: Normal in size without focal abnormality. Adrenals/Urinary Tract: No adrenal gland nodules. 7 mm stone in the distal left ureter at the ureterovesical junction with proximal hydronephrosis and hydroureter as well as stranding around the left kidney and ureter. Several additional stones demonstrated in the left kidney measuring less than 2 mm diameter. Right kidney and ureter are unremarkable. Bladder is unremarkable. Stomach/Bowel: Stomach, small bowel, and colon are not abnormally distended. No wall thickening or inflammatory changes. Appendix is surgically absent. Vascular/Lymphatic: Aortic atherosclerosis. No enlarged abdominal or pelvic lymph nodes. Reproductive: Prostate is unremarkable. Other: No abdominal wall hernia or abnormality. No abdominopelvic ascites. Musculoskeletal: No acute or significant osseous findings. IMPRESSION: 1. 7 mm stone in the distal left ureter with moderate proximal obstruction. 2. Several additional nonobstructing stones in the left kidney. 3. Mild aortic atherosclerosis. Electronically Signed   By: Lucienne Capers M.D.   On: 03/03/2021 20:54    Procedures Procedures   Medications Ordered in ED Medications  ondansetron (ZOFRAN) 4 MG/2ML injection (  Not Given 03/04/21 0040)  ondansetron (ZOFRAN) injection 4 mg (4 mg Intravenous Given 03/03/21 2039)  ketorolac (TORADOL) 30 MG/ML injection 30 mg (30 mg Intravenous Given 03/04/21 0050)    ED Course  I have reviewed the triage vital signs and the nursing notes.  Pertinent labs & imaging results that were available during my care of the patient were reviewed by me and considered in my medical decision making (see chart for details).    MDM Rules/Calculators/A&P                         Left flank pain strongly suggestive of renal colic.  Labs show elevated lipase, hematuria.  CT scan shows 7 mm calculus in the distal left ureter.  By the time I saw  him, he was pain-free following a dose of ketorolac and I suspect that the calculus had passed into the bladder.  Old records are reviewed, and he has no relevant past visits.  He is discharged with prescriptions for tamsulosin, ondansetron, hydrocodone-acetaminophen and is referred back to his urologist for follow-up.  Return precautions discussed.  Final Clinical Impression(s) / ED Diagnoses Final diagnoses:  Renal colic on left side  Ureterolithiasis  Elevated lipase    Rx / DC Orders ED Discharge Orders          Ordered    tamsulosin (FLOMAX) 0.4 MG CAPS capsule  Daily        03/04/21 0250    ondansetron (ZOFRAN) 4 MG tablet  Every 6 hours PRN        03/04/21 0250    HYDROcodone-acetaminophen (NORCO) 5-325 MG tablet  Every 4 hours PRN        03/04/21 6301             Delora Fuel, MD 60/10/93 [phone]

## 2021-03-04 NOTE — Discharge Instructions (Signed)
Drink plenty of fluids.  Return if pain is not being adequately controlled at home, or if you start running a fever.

## 2021-03-10 DIAGNOSIS — N201 Calculus of ureter: Secondary | ICD-10-CM | POA: Diagnosis not present

## 2021-03-10 DIAGNOSIS — R8271 Bacteriuria: Secondary | ICD-10-CM | POA: Diagnosis not present

## 2021-03-20 ENCOUNTER — Other Ambulatory Visit: Payer: Self-pay | Admitting: Cardiology

## 2021-03-29 ENCOUNTER — Ambulatory Visit: Payer: BC Managed Care – PPO | Admitting: Cardiology

## 2021-04-01 ENCOUNTER — Encounter: Payer: Self-pay | Admitting: Cardiology

## 2021-04-01 ENCOUNTER — Other Ambulatory Visit: Payer: Self-pay

## 2021-04-01 ENCOUNTER — Ambulatory Visit: Payer: BC Managed Care – PPO | Admitting: Cardiology

## 2021-04-01 VITALS — BP 132/78 | HR 48 | Temp 98.2°F | Resp 17 | Ht 74.0 in | Wt 220.6 lb

## 2021-04-01 DIAGNOSIS — E7841 Elevated Lipoprotein(a): Secondary | ICD-10-CM

## 2021-04-01 DIAGNOSIS — I2510 Atherosclerotic heart disease of native coronary artery without angina pectoris: Secondary | ICD-10-CM | POA: Insufficient documentation

## 2021-04-01 DIAGNOSIS — I10 Essential (primary) hypertension: Secondary | ICD-10-CM | POA: Insufficient documentation

## 2021-04-01 DIAGNOSIS — E7800 Pure hypercholesterolemia, unspecified: Secondary | ICD-10-CM | POA: Diagnosis not present

## 2021-04-01 DIAGNOSIS — I25118 Atherosclerotic heart disease of native coronary artery with other forms of angina pectoris: Secondary | ICD-10-CM

## 2021-04-01 DIAGNOSIS — R011 Cardiac murmur, unspecified: Secondary | ICD-10-CM

## 2021-04-01 MED ORDER — EZETIMIBE 10 MG PO TABS: 10.0000 mg | ORAL_TABLET | Freq: Every day | ORAL | 3 refills | Status: DC

## 2021-04-01 MED ORDER — LOSARTAN POTASSIUM 100 MG PO TABS: 100.0000 mg | ORAL_TABLET | Freq: Every day | ORAL | 3 refills | Status: DC

## 2021-04-01 MED ORDER — EZETIMIBE 10 MG PO TABS
10.0000 mg | ORAL_TABLET | Freq: Every day | ORAL | 3 refills | Status: DC
Start: 2021-04-01 — End: 2021-04-02

## 2021-04-01 NOTE — Progress Notes (Signed)
Primary Physician/Referring:  Sueanne Margarita, DO  Patient ID: Brandon Short, male    DOB: 1960-07-21, 61 y.o.   MRN: [id number]  Chief Complaint  Patient presents with   Coronary Artery Disease   Hypertension   F/U labs   Follow-up    labs   HPI:    TRAVANTE KNEE  is a 61 y.o. Caucasian male patient with history of asymptomatic sinus node dysfunction with marked sinus bradycardia, who exercises fairly regularly and runs at least between 30 to 35 miles a week, hypertension, coronary artery disease with STEMI on 01/16/2021 in the anterolateral wall, underwent stenting to LAD and RCA at [facility] clinic.  Again in traveling to [place], developed hydronephrosis related to renal stone, underwent extraction on 03/06/2021.  Dual antiplatelet therapy was held for couple days. Accompanied by his wife.  Remains asymptomatic.  Past Medical History:  Diagnosis Date   Heart attack ([place]) 01/16/2021   After running a 13 mile marathon   Hyperlipidemia    Hypertension    Past Surgical History:  Procedure Laterality Date   APPENDECTOMY  03/08/2003   KIDNEY STONE SURGERY  02/2021   Family History  Problem Relation Age of Onset   Cancer - Lung Mother    Prostate cancer Father    Dementia Father    Crohn's disease Sister     Social History   Tobacco Use   Smoking status: Never   Smokeless tobacco: Never  Substance Use Topics   Alcohol use: No   Marital Status: Single  ROS  Review of Systems  Cardiovascular:  Negative for chest pain, dyspnea on exertion and leg swelling.  Gastrointestinal:  Negative for melena.  Objective  Blood pressure 132/78, pulse (!) 48, temperature 98.2 F (36.8 C), temperature source Temporal, resp. rate 17, height $RemoveBe'6\' 2"'QmTvwpnPF$  (1.88 m), weight 220 lb 9.6 oz (100.1 kg), SpO2 98 %. Body mass index is 28.32 kg/m.  Vitals with BMI 04/01/2021 04/01/2021 03/04/2021  Height - $Remove'6\' 2"'agQsLyG$  -  Weight - 220 lbs 10 oz -  BMI - 62.70 -  Systolic 350 093 818  Diastolic 78 81  81  Pulse 48 42 51    Physical Exam Neck:     Vascular: No carotid bruit or JVD.  Cardiovascular:     Rate and Rhythm: Regular rhythm. Bradycardia present.     Pulses: Intact distal pulses.     Heart sounds: Murmur heard.  Early systolic murmur is present with a grade of 2/6 at the upper right sternal border.    No gallop.  Pulmonary:     Effort: Pulmonary effort is normal.     Breath sounds: Normal breath sounds.  Abdominal:     General: Bowel sounds are normal.     Palpations: Abdomen is soft.  Musculoskeletal:        General: No swelling.     Laboratory examination:   Recent Labs    02/11/21 0821 03/03/21 2026  NA 145* 142  K 4.6 4.0  CL 108* 108  CO2 22 24  GLUCOSE 90 112*  BUN 19 31*  CREATININE 1.10 1.40*  CALCIUM 9.1 9.3  GFRNONAA  --  58*   CrCl cannot be calculated (Patient's most recent lab result is older than the maximum 21 days allowed.).  CMP Latest Ref Rng & Units 03/03/2021 02/11/2021  Glucose 70 - 99 mg/dL 112(H) 90  BUN 6 - 20 mg/dL 31(H) 19  Creatinine 0.61 - 1.24 mg/dL 1.40(H) 1.10  Sodium 135 -  145 mmol/L 142 145(H)  Potassium 3.5 - 5.1 mmol/L 4.0 4.6  Chloride 98 - 111 mmol/L 108 108(H)  CO2 22 - 32 mmol/L 24 22  Calcium 8.9 - 10.3 mg/dL 9.3 9.1  Total Protein 6.5 - 8.1 g/dL 6.6 6.2  Total Bilirubin 0.3 - 1.2 mg/dL 0.7 0.8  Alkaline Phos 38 - 126 U/L 69 85  AST 15 - 41 U/L 27 23  ALT 0 - 44 U/L 32 18   CBC Latest Ref Rng & Units 03/03/2021 02/11/2021  WBC 4.0 - 10.5 K/uL 10.3 6.2  Hemoglobin 13.0 - 17.0 g/dL 14.9 15.6  Hematocrit 39.0 - 52.0 % 42.8 45.3  Platelets 150 - 400 K/uL 235 233    Lipid Panel Recent Labs    02/11/21 0821  CHOL 148  TRIG 64  LDLCALC 70  HDL 65   Lipid Panel     Component Value Date/Time   CHOL 148 02/11/2021 0821   TRIG 64 02/11/2021 0821   HDL 65 02/11/2021 0821   LDLCALC 70 02/11/2021 0821   LABVLDL 13 02/11/2021 0821    Advanced lipid profile labs 02/11/2021:  Apolipoprotein A-1 101 - 178  mg/dL 170  Apolipoprotein B <90 mg/dL  64   Apolipo. B/A-1 Ratio 0.0 - 0.7 ratio  0.4  Normal.   CRP, High Sensitivity 0.00 - 3.00 mg/L 0.42   Lipoprotein (a) <75.0 nmol/L    134.0 High    External labs:   Labs 01/18/2021: Total cholesterol 184, triglycerides 114, HDL 67, LDL 96.  Serum glucose 95 mg, BUN 19, creatinine 0.89, EGFR >60 mL, potassium 4.1.  ALT 27, AST 114.  Peak serum troponin 3614.  Hb 13.2/HCT 39.1, platelets 215.  A1C 4.900 % 12/17/2020 TSH 1.130 12/17/2020  Labs 11/27/2019: CHOLESTEROL 231   100 - 200  TRIGLYCERIDES 72   30 - 149  HDL 74   40 - 60  LDL 143   <  CHOL/HDL RATIO 3.1   <  LDL/HDL RATIO 1.9   1.7 - 2.5  NON-HDL 157.0   <   Medications and allergies  No Known Allergies   Medication prior to this encounter:   Outpatient Medications Prior to Visit  Medication Sig Dispense Refill   aspirin 81 MG chewable tablet Chew 1 tablet by mouth daily.     atorvastatin (LIPITOR) 80 MG tablet Take 80 mg by mouth daily.     HYDROcodone-acetaminophen (NORCO) 5-325 MG tablet Take 1 tablet by mouth every 4 (four) hours as needed for moderate pain. 10 tablet 0   L-Lysine 500 MG TABS Take 1 tablet by mouth daily.     nitroGLYCERIN (NITROSTAT) 0.4 MG SL tablet Place 1 tablet under the tongue as needed for chest pain.     prasugrel (EFFIENT) 10 MG TABS tablet Take 1 tablet (10 mg total) by mouth daily. 90 tablet 3   amLODipine (NORVASC) 5 MG tablet Take 1 tablet (5 mg total) by mouth daily. 90 tablet 1   losartan (COZAAR) 50 MG tablet TAKE 1 TABLET(50 MG) BY MOUTH EVERY EVENING 90 tablet 3   cephALEXin (KEFLEX) 500 MG capsule Take 500 mg by mouth 2 (two) times daily.     DOK 100 MG capsule Take 100 mg by mouth 2 (two) times daily.     ondansetron (ZOFRAN) 4 MG tablet Take 1 tablet (4 mg total) by mouth every 6 (six) hours as needed for nausea or vomiting. 12 tablet 0   tamsulosin (FLOMAX) 0.4 MG CAPS capsule Take  1 capsule (0.4 mg total) by mouth daily. 10  capsule 0   No facility-administered medications prior to visit.     Medication list after today's encounter   Current Outpatient Medications  Medication Instructions   aspirin 81 MG chewable tablet 1 tablet, Oral, Daily   atorvastatin (LIPITOR) 80 mg, Oral, Daily   ezetimibe (ZETIA) 10 mg, Oral, Daily   HYDROcodone-acetaminophen (NORCO) 5-325 MG tablet 1 tablet, Oral, Every 4 hours PRN   L-Lysine 500 MG TABS 1 tablet, Oral, Daily   losartan (COZAAR) 100 mg, Oral, Daily   nitroGLYCERIN (NITROSTAT) 0.4 MG SL tablet 1 tablet, Sublingual, As needed   prasugrel (EFFIENT) 10 mg, Oral, Daily    Radiology:   Chest x-ray PA and lateral view 01/17/2021: The lungs are clear.  The cardiac silhouette is stable.  There is no  pneumothorax or pleural effusion.  Cardiac Studies:   Coronary Angiogram 01/17/2021 patent stents 01/19/2021: LMCA: Normal 0% stenosis.  LAD: Mid 80% stenosis with an ulcerated plaque, reduced to 0% with PTCA/DES with Resolute Onyx 3.0 x 18 DES.Marland Kitchen Apical LAD is small caliber with diffuse 50-60% stenosis  Diagonals are small and patent   LCx: Mild irregularities 20-30%.OM 1 is small and patent  OM 2 has very distal 80% stenosis (small caliber vessel)  RCA: Mild irregularities 10-20%.Large and dominant  RPL is large with proximal 75% eccentric stenosis, reduced to 0% with Resolute Onyx 3.0 x 15 DES. RPDA is large with mid 30-40% stenosis.  Echocardiogram 01/19/2021: Normal LV size, mildly increased LV wall thickness. Normal LV wall motions. Normal LV systolic function. EF > 55%  RV appears mildly dilated, normal function  LA appears mildly dilated  AV is tricuspid opens well, mild AR  No significant pericardial effusion seen  IVC appears dilated, normal inspiratory collapse   EKG:   EKG 01/25/2021: Marked sinus bradycardia at rate of 39 bpm with first-degree AV block, leftward axis, anteroseptal infarct old, there is persistent ST elevation in V2 to V4.  Compared to  01/06/2020, no significant change, previously ventricular rate was 29 bpm.  ST elevation in V2 through V4 not present, previously early repolarization was evident.  Assessment     ICD-10-CM   1. Coronary artery disease involving native coronary artery of native heart with other form of angina pectoris ([place])  I25.118 PCV ECHOCARDIOGRAM COMPLETE    2. Primary hypertension  O67 Basic metabolic panel    losartan (COZAAR) 100 MG tablet    DISCONTINUED: losartan (COZAAR) 100 MG tablet    DISCONTINUED: losartan (COZAAR) 100 MG tablet    3. Hypercholesteremia  E78.00 Lipid Panel With LDL/HDL Ratio    ezetimibe (ZETIA) 10 MG tablet    DISCONTINUED: ezetimibe (ZETIA) 10 MG tablet    4. Elevated Lp(a)  E78.41     5. Murmur  R01.1 PCV ECHOCARDIOGRAM COMPLETE      Orders Placed This Encounter  Procedures   Lipid Panel With LDL/HDL Ratio   Basic metabolic panel   PCV ECHOCARDIOGRAM COMPLETE    Standing Status:   Future    Standing Expiration Date:   04/01/2022   Recommendations:   ABED SCHAR is a 61 y.o. Caucasian male patient with history of asymptomatic sinus node dysfunction with marked sinus bradycardia, who exercises fairly regularly and runs at least between 30 to 35 miles a week, hypertension, coronary artery disease with STEMI on 01/16/2021 in the anterolateral wall, underwent stenting to LAD and RCA at [facility] clinic.  Again in traveling to  [place], developed hydronephrosis related to renal stone, underwent extraction on 03/06/2021.  Dual antiplatelet therapy was held for couple days.  Presently doing well, no recurrence of chest pain.  In view of marked underlying sinus bradycardia, I have discontinued amlodipine, will increase his losartan to 100 mg daily.  He will continue to monitor his blood pressure closely, goal blood pressure 130/80 mmHg or less.  Reviewed his labs, he primarily has hypercholesterolemia in 2021 LDL was around 150.  However with no other underlying  cardiovascular risk factors, he was not on therapy.  Presently on Lipitor, LDL is at 70, would like to bring it down to <54 as his LPA is elevated.  I will also look into clinical trials for elevated LPA in patients with multivessel coronary disease.  I will add Zetia 10 mg daily, will recheck lipids in 2 months to 3 months and I would like to see him back then.  If he remains stable I will see him back in 6 months following which he can be seen on an annual basis.  I am still concerned about the murmur that he has, I would like to repeat echocardiogram to exclude any structural abnormalities especially a small VSD.  He can now resume his routine exercise but advised him to avoid extreme cold weather while running.  Wife present and all questions answered.  40-minute office visit encounter.     Adrian Prows, MD, [facility] 04/01/2021, 3:41 PM Office: [phone]

## 2021-04-02 ENCOUNTER — Other Ambulatory Visit: Payer: Self-pay

## 2021-04-02 ENCOUNTER — Ambulatory Visit: Payer: BC Managed Care – PPO

## 2021-04-02 DIAGNOSIS — I25118 Atherosclerotic heart disease of native coronary artery with other forms of angina pectoris: Secondary | ICD-10-CM

## 2021-04-02 DIAGNOSIS — R011 Cardiac murmur, unspecified: Secondary | ICD-10-CM

## 2021-04-02 DIAGNOSIS — I10 Essential (primary) hypertension: Secondary | ICD-10-CM

## 2021-04-02 DIAGNOSIS — E7800 Pure hypercholesterolemia, unspecified: Secondary | ICD-10-CM

## 2021-04-02 MED ORDER — EZETIMIBE 10 MG PO TABS: 10.0000 mg | ORAL_TABLET | Freq: Every day | ORAL | 3 refills | Status: DC

## 2021-04-02 MED ORDER — LOSARTAN POTASSIUM 100 MG PO TABS: 100.0000 mg | ORAL_TABLET | Freq: Every day | ORAL | 3 refills | Status: DC

## 2021-04-05 NOTE — Telephone Encounter (Signed)
From pt

## 2021-04-21 DIAGNOSIS — N202 Calculus of kidney with calculus of ureter: Secondary | ICD-10-CM | POA: Diagnosis not present

## 2021-04-22 ENCOUNTER — Other Ambulatory Visit: Payer: Self-pay | Admitting: Cardiology

## 2021-05-21 ENCOUNTER — Other Ambulatory Visit: Payer: Self-pay

## 2021-05-21 ENCOUNTER — Encounter: Payer: Self-pay | Admitting: Cardiology

## 2021-05-21 MED ORDER — ASPIRIN 81 MG PO CHEW: 81.0000 mg | CHEWABLE_TABLET | Freq: Every day | ORAL | 3 refills | Status: DC

## 2021-05-21 MED ORDER — ATORVASTATIN CALCIUM 80 MG PO TABS: 80.0000 mg | ORAL_TABLET | Freq: Every day | ORAL | 3 refills | Status: DC

## 2021-06-18 DIAGNOSIS — I10 Essential (primary) hypertension: Secondary | ICD-10-CM | POA: Diagnosis not present

## 2021-06-19 LAB — BASIC METABOLIC PANEL
BUN/Creatinine Ratio: 18 (ref 10–24)
BUN: 24 mg/dL (ref 8–27)
CO2: 21 mmol/L (ref 20–29)
Calcium: 9.5 mg/dL (ref 8.6–10.2)
Chloride: 105 mmol/L (ref 96–106)
Creatinine, Ser: 1.33 mg/dL — ABNORMAL HIGH (ref 0.76–1.27)
Glucose: 90 mg/dL (ref 70–99)
Potassium: 4.7 mmol/L (ref 3.5–5.2)
Sodium: 141 mmol/L (ref 134–144)
eGFR: 61 mL/min/{1.73_m2} (ref >59)

## 2021-06-30 ENCOUNTER — Ambulatory Visit: Payer: BC Managed Care – PPO | Admitting: Cardiology

## 2021-06-30 ENCOUNTER — Encounter: Payer: Self-pay | Admitting: Cardiology

## 2021-06-30 VITALS — BP 125/75 | HR 45 | Temp 98.7°F | Resp 16 | Ht 74.0 in | Wt 224.4 lb

## 2021-06-30 DIAGNOSIS — E7800 Pure hypercholesterolemia, unspecified: Secondary | ICD-10-CM | POA: Diagnosis not present

## 2021-06-30 DIAGNOSIS — I10 Essential (primary) hypertension: Secondary | ICD-10-CM

## 2021-06-30 DIAGNOSIS — I25118 Atherosclerotic heart disease of native coronary artery with other forms of angina pectoris: Secondary | ICD-10-CM | POA: Diagnosis not present

## 2021-06-30 DIAGNOSIS — I351 Nonrheumatic aortic (valve) insufficiency: Secondary | ICD-10-CM

## 2021-06-30 NOTE — Progress Notes (Signed)
? ?Primary Physician/Referring:  Sueanne Margarita, DO ? ?Patient ID: Brandon Short, male    DOB: [birth year], 61 y.o.   MRN: [id number] ? ?Chief Complaint  ?Patient presents with  ? Coronary Artery Disease  ? Hyperlipidemia  ? Follow-up  ?  3 months  ? ?HPI:   ? ?Brandon Short  is a 61 y.o. Caucasian male patient with history of asymptomatic sinus node dysfunction with marked sinus bradycardia, who exercises fairly regularly and runs at least between 30 to 35 miles a week, hypertension, coronary artery disease with STEMI on 01/16/2021 in the anterolateral wall, underwent stenting to LAD and RCA at [facility] clinic.  He also has history of ureteric stones extracted on 03/06/2021. ? ?This is a 61-monthoffice visit, is presently doing well and asymptomatic except he has developed mild myalgias in his legs, patient is a marathon runner and has been having difficulty running. Accompanied by his wife. ? ?Past Medical History:  ?Diagnosis Date  ? [place]  ? After running a 13 mile marathon  ? Hyperlipidemia   ? Hypertension   ? ?Past Surgical History:  ?Procedure Laterality Date  ? APPENDECTOMY  03/08/2003  ? KIDNEY STONE SURGERY  02/2021  ? ?Family History  ?Problem Relation Age of Onset  ? Cancer - Lung Mother   ? Prostate cancer Father   ? Dementia Father   ? Crohn's disease Sister   ?  ?Social History  ? ?Tobacco Use  ? Smoking status: Never  ? Smokeless tobacco: Never  ?Substance Use Topics  ? Alcohol use: No  ? ?Marital Status: Single  ?ROS  ?Review of Systems  ?Cardiovascular:  Negative for chest pain, dyspnea on exertion and leg swelling.  ?Gastrointestinal:  Negative for melena.  ?Objective  ?Blood pressure 125/75, pulse (!) 45, temperature 98.7 ?F (37.1 ?C), temperature source Temporal, resp. rate 16, height _0  (1.88 m), weight 224 lb 6.4 oz (101.8 kg), SpO2 98 %. Body mass index is 28.81 kg/m?.  ? ?  06/30/2021  ?  8:42 AM 04/01/2021  ? 12:09 PM 04/01/2021  ? 11:54 AM  ?Vitals with BMI   ?Height _1   _2   ?Weight 224 lbs 6 oz  220 lbs 10 oz  ?BMI 28.8  28.31  ?Systolic [id number] ?Diastolic 75 78 81  ?Pulse 45 48 42  ?  ?Physical Exam ?Neck:  ?   Vascular: No carotid bruit or JVD.  ?Cardiovascular:  ?   Rate and Rhythm: Regular rhythm. Bradycardia present.  ?   Pulses: Intact distal pulses.  ?   Heart sounds: Murmur heard.  ?Early systolic murmur is present with a grade of 2/6 at the upper right sternal border.  ?  No gallop.  ?Pulmonary:  ?   Effort: Pulmonary effort is normal.  ?   Breath sounds: Normal breath sounds.  ?Abdominal:  ?   General: Bowel sounds are normal.  ?   Palpations: Abdomen is soft.  ?Musculoskeletal:     ?   General: No swelling.  ?  ? ?Laboratory examination:  ? ?Recent Labs  ?  02/11/21 ?0349112/28/22 ?2026 06/18/21 ?0913  ?NA 145* 142 141  ?K 4.6 4.0 4.7  ?CL 108* 108 105  ?CO2 _3 ?GLUCOSE 90 112* 90  ?BUN 19 31* 24  ?CREATININE 1.10 1.40* 1.33*  ?CALCIUM 9.1 9.3 9.5  ?GFRNONAA  --  54  --   ? ?estimated creatinine clearance is 75.2 mL/min (  A) (by C-G formula based on SCr of 1.33 mg/dL (H)).  ? ?  Latest Ref Rng & Units 06/18/2021  ?  9:13 AM 03/03/2021  ?  8:26 PM 02/11/2021  ?  8:21 AM  ?CMP  ?Glucose 70 - 99 mg/dL 90   112   90    ?BUN 8 - 27 mg/dL _0 ?Creatinine 0.76 - 1.27 mg/dL 1.33   1.40   1.10    ?Sodium 134 - 144 mmol/L 141   142   145    ?Potassium 3.5 - 5.2 mmol/L 4.7   4.0   4.6    ?Chloride 96 - 106 mmol/L 105   108   108    ?CO2 20 - 29 mmol/L _1 ?Calcium 8.6 - 10.2 mg/dL 9.5   9.3   9.1    ?Total Protein 6.5 - 8.1 g/dL  6.6   6.2    ?Total Bilirubin 0.3 - 1.2 mg/dL  0.7   0.8    ?Alkaline Phos 38 - 126 U/L  69   85    ?AST 15 - 41 U/L  27   23    ?ALT 0 - 44 U/L  32   18    ? ? ?  Latest Ref Rng & Units 03/03/2021  ?  8:26 PM 02/11/2021  ?  8:21 AM  ?CBC  ?WBC 4.0 - 10.5 K/uL 10.3   6.2    ?Hemoglobin 13.0 - 17.0 g/dL 14.9   15.6    ?Hematocrit 39.0 - 52.0 % 42.8   45.3    ?Platelets 150 - 400 K/uL 235   233     ? ? ?Lipid Panel ?Recent Labs  ?  02/11/21 ?5093  ?CHOL 148  ?TRIG 64  ?[place] 70  ?HDL 65  ? ?Lipid Panel  ?   ?Component Value Date/Time  ? CHOL 148 02/11/2021 0821  ? TRIG 64 02/11/2021 0821  ? HDL 65 02/11/2021 0821  ? [place] 70 02/11/2021 0821  ? LABVLDL 13 02/11/2021 0821  ?  ?Advanced lipid profile labs 02/11/2021: ? ?Apolipoprotein A-1 101 - 178 mg/dL 170  ?Apolipoprotein B <90 mg/dL  64  ? ?Apolipo. B/A-1 Ratio 0.0 - 0.7 ratio  0.4  Normal.  ? ?CRP, High Sensitivity 0.00 - 3.00 mg/L 0.42  ? ?Lipoprotein (a) <75.0 nmol/L    134.0 High   ? ?External labs:  ? ?Labs 01/18/2021: ?Total cholesterol 184, triglycerides 114, HDL 67, LDL 96. ? ?Serum glucose 95 mg, BUN 19, creatinine 0.89, EGFR >60 mL, potassium 4.1.  ALT 27, AST 114.  Peak serum troponin 3614. ? ?Hb 13.2/HCT 39.1, platelets 215. ? ?A1C 4.900 % 12/17/2020 ?TSH 1.130 12/17/2020 ? ?Labs 11/27/2019: ?CHOLESTEROL 231   100 - 200  ?TRIGLYCERIDES 72   30 - 149  ?HDL 74   40 - 60  ?LDL 143   <  ?CHOL/HDL RATIO 3.1   <  ?LDL/HDL RATIO 1.9   1.7 - 2.5  ?NON-HDL 157.0   <  ? ?Medications and allergies  ?No Known Allergies  ? ?Medication prior to this encounter:  ? ?Outpatient Medications Prior to Visit  ?Medication Sig Dispense Refill  ? amLODipine (NORVASC) 5 MG tablet Take 5 mg by mouth daily. Hold starting 06/30/24    ? aspirin 81 MG chewable tablet Chew 1 tablet (81 mg total) by mouth daily. 30 tablet 3  ?  ezetimibe (ZETIA) 10 MG tablet Take 1 tablet (10 mg total) by mouth daily. 90 tablet 3  ? L-Lysine 500 MG TABS Take 1 tablet by mouth daily.    ? losartan (COZAAR) 100 MG tablet Take 1 tablet (100 mg total) by mouth daily. 90 tablet 3  ? nitroGLYCERIN (NITROSTAT) 0.4 MG SL tablet Place 1 tablet under the tongue as needed for chest pain.    ? prasugrel (EFFIENT) 10 MG TABS tablet Take 1 tablet (10 mg total) by mouth daily. 90 tablet 3  ? atorvastatin (LIPITOR) 80 MG tablet Take 1 tablet (80 mg total) by mouth daily. 90 tablet 3  ? atorvastatin (LIPITOR)  40 MG tablet Take 1 tablet (40 mg total) by mouth daily.    ? HYDROcodone-acetaminophen (NORCO) 5-325 MG tablet Take 1 tablet by mouth every 4 (four) hours as needed for moderate pain. 10 tablet 0  ? ?No facility-administered medications prior to visit.  ?  ? ?Medication list after today's encounter  ? ?Current Outpatient Medications  ?Medication Instructions  ? amLODipine (NORVASC) 5 mg, Oral, Daily, Hold starting 06/30/24  ? aspirin 81 mg, Oral, Daily  ? atorvastatin (LIPITOR) 40 mg, Oral, Daily  ? ezetimibe (ZETIA) 10 mg, Oral, Daily  ? L-Lysine 500 MG TABS 1 tablet, Oral, Daily  ? losartan (COZAAR) 100 mg, Oral, Daily  ? nitroGLYCERIN (NITROSTAT) 0.4 MG SL tablet 1 tablet, Sublingual, As needed  ? prasugrel (EFFIENT) 10 mg, Oral, Daily  ? ? ?Radiology:  ? ?Chest x-ray PA and lateral view 01/17/2021: ?The lungs are clear.  The cardiac silhouette is stable.  There is no  ?pneumothorax or pleural effusion. ? ?Cardiac Studies:  ? ?Coronary Angiogram 01/17/2021 patent stents 01/19/2021: ?LMCA: Normal 0% stenosis.  ?LAD: Mid 80% stenosis with an ulcerated plaque, reduced to 0% with PTCA/DES with Resolute Onyx 3.0 x 18 DES.Marland Kitchen Apical LAD is small caliber with diffuse 50-60% stenosis  ?Diagonals are small and patent  ? LCx: Mild irregularities 20-30%.OM 1 is small and patent  ?OM 2 has very distal 80% stenosis (small caliber vessel)  ?RCA: Mild irregularities 10-20%.Large and dominant  RPL is large with proximal 75% eccentric stenosis, reduced to 0% with Resolute Onyx 3.0 x 15 DES. RPDA is large with mid 30-40% stenosis. ? ?PCV ECHOCARDIOGRAM COMPLETE 04/02/2021  ?Left ventricle cavity is normal in size and wall thickness. Normal global wall motion. Normal LV systolic function with EF 65%. Normal diastolic filling pattern. ?Left atrial cavity is mildly dilated. ?Structurally normal trileaflet aortic valve. Mild to moderate aortic regurgitation. Mildly increased aortic valve velocity likely due to aortic regurgitation. No  evidence of aortic stenosis. ?Mild (Grade I) mitral regurgitation. ?Mild tricuspid regurgitation. ?Mild pulmonic regurgitation. ?No evidence of pulmonary hypertension. ?No evidence of VSD. Murmur likely due to ab

## 2021-07-01 LAB — LDL CHOLESTEROL, DIRECT: LDL Direct: 46 mg/dL (ref 0–99)

## 2021-08-05 HISTORY — PX: SHOULDER SURGERY: SHX246

## 2021-08-09 ENCOUNTER — Other Ambulatory Visit: Payer: Self-pay | Admitting: Cardiology

## 2021-08-18 ENCOUNTER — Encounter: Payer: Self-pay | Admitting: Gastroenterology

## 2021-08-18 NOTE — Telephone Encounter (Signed)
Please advise 

## 2021-08-21 ENCOUNTER — Emergency Department (HOSPITAL_BASED_OUTPATIENT_CLINIC_OR_DEPARTMENT_OTHER): Payer: BC Managed Care – PPO | Admitting: Radiology

## 2021-08-21 ENCOUNTER — Encounter (HOSPITAL_BASED_OUTPATIENT_CLINIC_OR_DEPARTMENT_OTHER): Payer: Self-pay | Admitting: *Deleted

## 2021-08-21 ENCOUNTER — Emergency Department (HOSPITAL_BASED_OUTPATIENT_CLINIC_OR_DEPARTMENT_OTHER)
Admission: EM | Admit: 2021-08-21 | Discharge: 2021-08-22 | Disposition: A | Discharge status: Home / Self Care | Payer: BC Managed Care – PPO | Attending: Emergency Medicine | Admitting: Emergency Medicine

## 2021-08-21 ENCOUNTER — Other Ambulatory Visit: Payer: Self-pay

## 2021-08-21 DIAGNOSIS — S4991XA Unspecified injury of right shoulder and upper arm, initial encounter: Secondary | ICD-10-CM | POA: Diagnosis not present

## 2021-08-21 DIAGNOSIS — S0990XA Unspecified injury of head, initial encounter: Secondary | ICD-10-CM | POA: Diagnosis not present

## 2021-08-21 DIAGNOSIS — J9811 Atelectasis: Secondary | ICD-10-CM | POA: Diagnosis not present

## 2021-08-21 DIAGNOSIS — R079 Chest pain, unspecified: Secondary | ICD-10-CM | POA: Diagnosis not present

## 2021-08-21 DIAGNOSIS — S42021A Displaced fracture of shaft of right clavicle, initial encounter for closed fracture: Secondary | ICD-10-CM | POA: Diagnosis not present

## 2021-08-21 DIAGNOSIS — Z79899 Other long term (current) drug therapy: Secondary | ICD-10-CM | POA: Diagnosis not present

## 2021-08-21 DIAGNOSIS — Z23 Encounter for immunization: Secondary | ICD-10-CM | POA: Insufficient documentation

## 2021-08-21 DIAGNOSIS — S022XXA Fracture of nasal bones, initial encounter for closed fracture: Secondary | ICD-10-CM | POA: Diagnosis not present

## 2021-08-21 DIAGNOSIS — S8992XA Unspecified injury of left lower leg, initial encounter: Secondary | ICD-10-CM | POA: Insufficient documentation

## 2021-08-21 DIAGNOSIS — I10 Essential (primary) hypertension: Secondary | ICD-10-CM | POA: Diagnosis not present

## 2021-08-21 DIAGNOSIS — S2241XA Multiple fractures of ribs, right side, initial encounter for closed fracture: Secondary | ICD-10-CM | POA: Diagnosis not present

## 2021-08-21 HISTORY — DX: Calculus of kidney: N20.0

## 2021-08-21 HISTORY — DX: Bradycardia, unspecified: R00.1

## 2021-08-21 LAB — CBC WITH DIFFERENTIAL/PLATELET
Abs Immature Granulocytes: 0.07 10*3/uL (ref 0.00–0.07)
Basophils Absolute: 0 10*3/uL (ref 0.0–0.1)
Basophils Relative: 0 %
Eosinophils Absolute: 0.1 10*3/uL (ref 0.0–0.5)
Eosinophils Relative: 1 %
HCT: 41.7 % (ref 39.0–52.0)
Hemoglobin: 13.7 g/dL (ref 13.0–17.0)
Immature Granulocytes: 1 %
Lymphocytes Relative: 16 %
Lymphs Abs: 1.8 10*3/uL (ref 0.7–4.0)
MCH: 28.4 pg (ref 26.0–34.0)
MCHC: 32.9 g/dL (ref 30.0–36.0)
MCV: 86.3 fL (ref 80.0–100.0)
Monocytes Absolute: 0.7 10*3/uL (ref 0.1–1.0)
Monocytes Relative: 7 %
Neutro Abs: 8.3 10*3/uL — ABNORMAL HIGH (ref 1.7–7.7)
Neutrophils Relative %: 75 %
Platelets: 219 10*3/uL (ref 150–400)
RBC: 4.83 MIL/uL (ref 4.22–5.81)
RDW: 12.3 % (ref 11.5–15.5)
WBC: 11 10*3/uL — ABNORMAL HIGH (ref 4.0–10.5)
nRBC: 0 % (ref 0.0–0.2)

## 2021-08-21 MED ORDER — ONDANSETRON HCL 4 MG/2ML IJ SOLN
4.0000 mg | Freq: Once | INTRAMUSCULAR | Status: AC
Start: 2021-08-21 — End: 2021-08-21
  Administered 2021-08-21: 4 mg via INTRAVENOUS
  Filled 2021-08-21: qty 2

## 2021-08-21 MED ORDER — TETANUS-DIPHTH-ACELL PERTUSSIS 5-2.5-18.5 LF-MCG/0.5 IM SUSY
0.5000 mL | PREFILLED_SYRINGE | Freq: Once | INTRAMUSCULAR | Status: AC
  Administered 2021-08-21: 0.5 mL via INTRAMUSCULAR
  Filled 2021-08-21: qty 0.5

## 2021-08-21 MED ORDER — HYDROMORPHONE HCL 1 MG/ML IJ SOLN
1.0000 mg | Freq: Once | INTRAMUSCULAR | Status: AC
  Administered 2021-08-21: 1 mg via INTRAVENOUS
  Filled 2021-08-21: qty 1
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
## 2021-08-21 NOTE — ED Notes (Signed)
Pt arrives to room via w/c from Radiology at this time - no obvious distress observed.  Now being placed on continuous cardiac and pulse ox monitoring by ED tech.

## 2021-08-22 ENCOUNTER — Emergency Department (HOSPITAL_COMMUNITY): Payer: BC Managed Care – PPO

## 2021-08-22 DIAGNOSIS — S2241XA Multiple fractures of ribs, right side, initial encounter for closed fracture: Secondary | ICD-10-CM | POA: Diagnosis not present

## 2021-08-22 DIAGNOSIS — J9811 Atelectasis: Secondary | ICD-10-CM | POA: Diagnosis not present

## 2021-08-22 DIAGNOSIS — S42021A Displaced fracture of shaft of right clavicle, initial encounter for closed fracture: Secondary | ICD-10-CM | POA: Diagnosis not present

## 2021-08-22 DIAGNOSIS — S022XXA Fracture of nasal bones, initial encounter for closed fracture: Secondary | ICD-10-CM | POA: Diagnosis not present

## 2021-08-22 DIAGNOSIS — R079 Chest pain, unspecified: Secondary | ICD-10-CM | POA: Diagnosis not present

## 2021-08-22 DIAGNOSIS — M79662 Pain in left lower leg: Secondary | ICD-10-CM | POA: Diagnosis not present

## 2021-08-22 LAB — BASIC METABOLIC PANEL
Anion gap: 7 (ref 5–15)
BUN: 30 mg/dL — ABNORMAL HIGH (ref 6–20)
CO2: 26 mmol/L (ref 22–32)
Calcium: 9.6 mg/dL (ref 8.9–10.3)
Chloride: 107 mmol/L (ref 98–111)
Creatinine, Ser: 1.4 mg/dL — ABNORMAL HIGH (ref 0.61–1.24)
GFR, Estimated: 58 mL/min — ABNORMAL LOW (ref >60)
Glucose, Bld: 137 mg/dL — ABNORMAL HIGH (ref 70–99)
Potassium: 4.2 mmol/L (ref 3.5–5.1)
Sodium: 140 mmol/L (ref 135–145)

## 2021-08-22 MED ORDER — IOHEXOL 350 MG/ML SOLN
75.0000 mL | Freq: Once | INTRAVENOUS | Status: AC | PRN
  Administered 2021-08-22: 75 mL via INTRAVENOUS

## 2021-08-22 MED ORDER — OXYCODONE-ACETAMINOPHEN 5-325 MG PO TABS: 1.0000 | ORAL_TABLET | Freq: Four times a day (QID) | ORAL | 0 refills | Status: DC | PRN

## 2021-08-22 MED ORDER — ONDANSETRON HCL 4 MG/2ML IJ SOLN
4.0000 mg | Freq: Once | INTRAMUSCULAR | Status: AC
Start: 2021-08-22 — End: 2021-08-22
  Administered 2021-08-22: 4 mg via INTRAVENOUS
  Filled 2021-08-22: qty 2

## 2021-08-22 MED ORDER — HYDROMORPHONE HCL 1 MG/ML IJ SOLN
1.0000 mg | INTRAMUSCULAR | Status: DC | PRN
  Administered 2021-08-22 (×2): 1 mg via INTRAVENOUS
  Filled 2021-08-22 (×2): qty 1

## 2021-08-22 MED ORDER — ONDANSETRON HCL 4 MG/2ML IJ SOLN
4.0000 mg | Freq: Once | INTRAMUSCULAR | Status: AC
  Administered 2021-08-22: 4 mg via INTRAVENOUS
  Filled 2021-08-22: qty 2

## 2021-08-22 NOTE — Progress Notes (Signed)
Orthopedic Tech Progress Note Patient Details:  Brandon Short Aug 12, 1960 [id number]  Ortho Devices Type of Ortho Device: Sling immobilizer Ortho Device/Splint Location: rue Ortho Device/Splint Interventions: Ordered, Application, Adjustment   Post Interventions Patient Tolerated: Well Instructions Provided: Care of device, Adjustment of device  Karolee Stamps 08/22/2021, 5:27 AM

## 2021-08-22 NOTE — ED Notes (Signed)
Wound care provide to [facility]

## 2021-08-22 NOTE — ED Notes (Signed)
Transported to ct at this time ?
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
## 2021-08-22 NOTE — Discharge Instructions (Addendum)
Begin taking Percocet as prescribed as needed for pain.  Ice for 20 minutes every 2 hours while awake for the next 2 days.  Wear arm sling for comfort and support.  Follow-up with your orthopedist in the next 2 to 3 days for a recheck of your clavicle fracture.

## 2021-08-22 NOTE — ED Notes (Signed)
Admit provider
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
## 2021-08-22 NOTE — ED Notes (Signed)
Provider at bedside at this time. A dry rag with an ice pack applied to rt shoulder at this time
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
## 2021-08-22 NOTE — ED Notes (Signed)
Pt requesting something to drink and asking if the test results are back. Provider sent a message reference same

## 2021-08-22 NOTE — ED Notes (Signed)
Ortho tech at bedside at this time. Pt was placed in a paper scrub top at this time. Pt given pain meds prior to placing pt in shirt and placing his rt arm into a sling.

## 2021-08-22 NOTE — ED Notes (Signed)
Rounded on pt. Pt denies needs at this time. VSS

## 2021-08-22 NOTE — ED Notes (Signed)
Pt attempted to urinate using a urinal however pt developed nausea with movement. Provider made aware of same at this time. Ortho tech called

## 2021-08-22 NOTE — ED Notes (Signed)
Returned from radiology at this time 
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
## 2021-08-23 ENCOUNTER — Encounter: Payer: Self-pay | Admitting: Cardiology

## 2021-08-23 DIAGNOSIS — I7121 Aneurysm of the ascending aorta, without rupture: Secondary | ICD-10-CM

## 2021-08-23 DIAGNOSIS — M25511 Pain in right shoulder: Secondary | ICD-10-CM | POA: Diagnosis not present

## 2021-08-23 DIAGNOSIS — I25118 Atherosclerotic heart disease of native coronary artery with other forms of angina pectoris: Secondary | ICD-10-CM

## 2021-08-23 NOTE — Telephone Encounter (Signed)
From patient.

## 2021-08-26 NOTE — Telephone Encounter (Signed)
Coronary artery disease involving native coronary artery of native heart with other form of angina pectoris ([place]) - Plan: PCV ECHOCARDIOGRAM COMPLETE  Aneurysm of ascending aorta without rupture ([place]) - Plan: PCV ECHOCARDIOGRAM COMPLETE  CTA Chest 08/22/2021:  1. Aortic dissection protocol with no acute vascular injury identified in the chest. Ascending thoracic aortic aneurysm up to 4.4 cm diameter. Recommend annual imaging followup by CTA or MRA. 2. Positive for acute rib fractures on the right ribs 2 through 6.4th and 5th ribs are fractured in two places. Trace right pleural fluid. Small right lung contusion. No pneumothorax. 3. Comminuted right clavicle shaft fracture and minimally displaced superior right scapula fracture. Mild hematoma and contusion in the right axilla.  4. Age indeterminate mild thoracic vertebral compression fractures. Of these, a mild T2 superior endplate fracture is most suspicious for acute injury.   Adrian Prows, MD, [facility] 08/26/2021, 1:14 PM Office: [phone] Fax: [phone] Pager: [phone]

## 2021-08-31 DIAGNOSIS — S42021A Displaced fracture of shaft of right clavicle, initial encounter for closed fracture: Secondary | ICD-10-CM | POA: Diagnosis not present

## 2021-08-31 DIAGNOSIS — G8918 Other acute postprocedural pain: Secondary | ICD-10-CM | POA: Diagnosis not present

## 2021-08-31 DIAGNOSIS — X58XXXA Exposure to other specified factors, initial encounter: Secondary | ICD-10-CM | POA: Diagnosis not present

## 2021-09-02 ENCOUNTER — Telehealth: Payer: Self-pay | Admitting: *Deleted

## 2021-09-02 NOTE — Telephone Encounter (Signed)
Called pt. Confirmed he is currently on a blood thinner. Scheduled an OV, cancelled colon and PV appts. Per protocol.

## 2021-09-10 DIAGNOSIS — S42021D Displaced fracture of shaft of right clavicle, subsequent encounter for fracture with routine healing: Secondary | ICD-10-CM | POA: Diagnosis not present

## 2021-09-16 ENCOUNTER — Other Ambulatory Visit: Payer: Self-pay | Admitting: Cardiology

## 2021-09-27 DIAGNOSIS — S42021D Displaced fracture of shaft of right clavicle, subsequent encounter for fracture with routine healing: Secondary | ICD-10-CM | POA: Diagnosis not present

## 2021-09-28 DIAGNOSIS — S42021G Displaced fracture of shaft of right clavicle, subsequent encounter for fracture with delayed healing: Secondary | ICD-10-CM | POA: Diagnosis not present

## 2021-09-29 NOTE — Progress Notes (Unsigned)
09/30/2021 Brandon Short [id number] [birth year]  Referring provider: Sueanne Margarita, DO Primary GI doctor: Dr. Lorenso Courier  ASSESSMENT AND PLAN:   History of colonic polyps We have discussed the risks of bleeding, infection, perforation, medication reactions, and remote risk of death associated with colonoscopy. All questions were answered and the patient acknowledges these risk and wishes to proceed.  Coronary artery disease involving native coronary artery of native heart with other form of angina pectoris (HCC) No chest pain, no shortness of breath Patient told to hold his Effient for 5 days prior to time of procedure. Will instruct when and how to resume after procedure. We will communicate with his prescribing physician Dr. Einar Gip to ensure that holding his  is acceptable. We discussed the risk, benefits and alternatives to colonoscopy/endoscopy.  We also discussed the low but real risk of cardiovascular event such as heart attack, stroke, embolism, thrombosis or ischemia/infarct of other organs off Effient and explained the need to seek urgent help if this occurs.  He is agreeable and wishes to proceed.  Bradycardia Asymptomatic   History of Present Illness:  61 y.o. male  with a past medical history of hypertension, hyperlipidemia, elevated LP(a), bradycardia, coronary artery disease status post stent LAD and RCA on 01/16/2021 currently on Effient and others listed below, returns to clinic today screening for colonoscopy on blood thinner. 10/04/2011 colonoscopyWith Dr. Deatra Ina for screening purposes, excellent prep with moviprep 3 hyperplastic polyps 2 to 3 mm in size recall 10 years He has bradycardia for all of his life, continues to run, no symptoms of dizziness, CP, SOB.  No family history of colon cancer.  Patient denies GERD.  Patient denies dysphagia, nausea, vomiting, melena.  Patient has a BM every daily.  Patient denies change in bowel habits, constipation, diarrhea,  hematochezia.  Denies changes in appetite, unintentional weight loss.    Current Medications:    Current Outpatient Medications (Cardiovascular):    atorvastatin (LIPITOR) 40 MG tablet, Take 1 tablet (40 mg total) by mouth daily.   ezetimibe (ZETIA) 10 MG tablet, Take 1 tablet (10 mg total) by mouth daily.   losartan (COZAAR) 100 MG tablet, Take 1 tablet (100 mg total) by mouth daily. (Patient taking differently: Take 50 mg by mouth daily.)   nitroGLYCERIN (NITROSTAT) 0.4 MG SL tablet, Place 1 tablet under the tongue as needed for chest pain. (Patient not taking: Reported on 09/30/2021)   Current Outpatient Medications (Analgesics):    aspirin 81 MG chewable tablet, CHEW AND SWALLOW 1 TABLET(81 MG) BY MOUTH DAILY  Current Outpatient Medications (Hematological):    prasugrel (EFFIENT) 10 MG TABS tablet, Take 1 tablet (10 mg total) by mouth daily.  Current Outpatient Medications (Other):    L-Lysine 500 MG TABS, Take 1 tablet by mouth daily.  Surgical History:  He  has a past surgical history that includes Appendectomy (03/08/2003); Kidney stone surgery (02/2021); Meniscus repair; and Shoulder surgery (Right, 08/2021). Family History:  His family history includes Cancer - Lung in his mother; Crohn's disease in his sister; Dementia in his father; Prostate cancer in his father. Social History:   reports that he has never smoked. He has never used smokeless tobacco. He reports that he does not drink alcohol and does not use drugs.  Current Medications, Allergies, Past Medical History, Past Surgical History, Family History and Social History were reviewed in [employer] record.  Physical Exam: BP 112/78   Pulse (!) 40   Ht '6\' 2"'$  (1.88 m)  Wt 223 lb 8 oz (101.4 kg)   SpO2 98%   BMI 28.70 kg/m  General:   Pleasant, well developed male in no acute distress Heart : bradycardia, regular rhythm; no murmurs Pulm: Clear anteriorly; no wheezing Abdomen:  Soft,  Non-distended AB, Active bowel sounds. No tenderness . Without guarding and Without rebound, No organomegaly appreciated. Rectal: Not evaluated Extremities:  without  edema. Neurologic:  Alert and  oriented x4;  No focal deficits.  Psych:  Cooperative. Normal mood and affect.   Vladimir Crofts, PA-C 09/30/21

## 2021-09-30 ENCOUNTER — Ambulatory Visit (INDEPENDENT_AMBULATORY_CARE_PROVIDER_SITE_OTHER): Payer: BC Managed Care – PPO | Admitting: Physician Assistant

## 2021-09-30 ENCOUNTER — Telehealth: Payer: Self-pay

## 2021-09-30 ENCOUNTER — Encounter: Payer: Self-pay | Admitting: Physician Assistant

## 2021-09-30 VITALS — BP 112/78 | HR 40 | Ht 74.0 in | Wt 223.5 lb

## 2021-09-30 DIAGNOSIS — R001 Bradycardia, unspecified: Secondary | ICD-10-CM | POA: Diagnosis not present

## 2021-09-30 DIAGNOSIS — S42021G Displaced fracture of shaft of right clavicle, subsequent encounter for fracture with delayed healing: Secondary | ICD-10-CM | POA: Diagnosis not present

## 2021-09-30 DIAGNOSIS — Z86010 Personal history of colonic polyps: Secondary | ICD-10-CM | POA: Diagnosis not present

## 2021-09-30 DIAGNOSIS — I25118 Atherosclerotic heart disease of native coronary artery with other forms of angina pectoris: Secondary | ICD-10-CM

## 2021-09-30 NOTE — Patient Instructions (Signed)
You have been scheduled for a colonoscopy. Please follow written instructions given to you at your visit today.  Please pick up your prep supplies at the pharmacy within the next 1-3 days. If you use inhalers (even only as needed), please bring them with you on the day of your procedure.  You will be contaced by our office prior to your procedure for directions on holding your Effient.  If you do not hear from our office 1 week prior to your scheduled procedure, please call [phone] to discuss.  Due to recent changes in healthcare laws, you may see the results of your imaging and laboratory studies on MyChart before your provider has had a chance to review them.  We understand that in some cases there may be results that are confusing or concerning to you. Not all laboratory results come back in the same time frame and the provider may be waiting for multiple results in order to interpret others.  Please give [place] 48 hours in order for your provider to thoroughly review all the results before contacting the office for clarification of your results.   I appreciate the opportunity to care for you. Vicie Mutters, PA-C

## 2021-09-30 NOTE — Progress Notes (Signed)
I agree with the assessment and plan as outlined by Ms. Collier. 

## 2021-09-30 NOTE — Telephone Encounter (Signed)
Anti-coag letter routed and faxed to Dr Einar Gip.

## 2021-09-30 NOTE — Telephone Encounter (Signed)
He has had stents to coronary arteries for acute MI on  01/17/2021 . Prefer to wait for 1 year unless urgent

## 2021-10-01 NOTE — Telephone Encounter (Signed)
I will inform Vicie Mutters PA-C to advise.

## 2021-10-01 NOTE — Telephone Encounter (Signed)
I left Brandon Short a detailed message and told him to call me back. I will go ahead and cancel the colon and put him on a November office visit recall list.

## 2021-10-05 NOTE — Telephone Encounter (Signed)
Spoke to Brandon Short and informed him of the plan. He verbalized understanding.

## 2021-10-06 ENCOUNTER — Encounter: Payer: BC Managed Care – PPO | Admitting: Gastroenterology

## 2021-10-12 ENCOUNTER — Encounter: Payer: Self-pay | Admitting: Cardiology

## 2021-10-12 DIAGNOSIS — S42021G Displaced fracture of shaft of right clavicle, subsequent encounter for fracture with delayed healing: Secondary | ICD-10-CM | POA: Diagnosis not present

## 2021-10-12 NOTE — Telephone Encounter (Signed)
From patient.

## 2021-10-13 NOTE — Telephone Encounter (Signed)
From patient.

## 2021-10-14 DIAGNOSIS — S42021G Displaced fracture of shaft of right clavicle, subsequent encounter for fracture with delayed healing: Secondary | ICD-10-CM | POA: Diagnosis not present

## 2021-10-19 DIAGNOSIS — S42021G Displaced fracture of shaft of right clavicle, subsequent encounter for fracture with delayed healing: Secondary | ICD-10-CM | POA: Diagnosis not present

## 2021-10-20 ENCOUNTER — Other Ambulatory Visit: Payer: Self-pay | Admitting: Cardiology

## 2021-10-21 DIAGNOSIS — S42021G Displaced fracture of shaft of right clavicle, subsequent encounter for fracture with delayed healing: Secondary | ICD-10-CM | POA: Diagnosis not present

## 2021-10-22 ENCOUNTER — Encounter: Payer: Self-pay | Admitting: Gastroenterology

## 2021-10-25 DIAGNOSIS — Z9889 Other specified postprocedural states: Secondary | ICD-10-CM | POA: Diagnosis not present

## 2021-10-28 DIAGNOSIS — S42021G Displaced fracture of shaft of right clavicle, subsequent encounter for fracture with delayed healing: Secondary | ICD-10-CM | POA: Diagnosis not present

## 2021-11-15 ENCOUNTER — Encounter: Payer: BC Managed Care – PPO | Admitting: Internal Medicine

## 2021-12-07 DIAGNOSIS — E7800 Pure hypercholesterolemia, unspecified: Secondary | ICD-10-CM | POA: Diagnosis not present

## 2021-12-07 DIAGNOSIS — I25118 Atherosclerotic heart disease of native coronary artery with other forms of angina pectoris: Secondary | ICD-10-CM | POA: Diagnosis not present

## 2021-12-08 LAB — LIPID PANEL WITH LDL/HDL RATIO
Cholesterol, Total: 215 mg/dL — ABNORMAL HIGH (ref 100–199)
HDL: 74 mg/dL (ref >39)
LDL Chol Calc (NIH): 112 mg/dL — ABNORMAL HIGH (ref 0–99)
LDL/HDL Ratio: 1.5 ratio (ref 0.0–3.6)
Triglycerides: 168 mg/dL — ABNORMAL HIGH (ref 0–149)
VLDL Cholesterol Cal: 29 mg/dL (ref 5–40)

## 2021-12-08 LAB — HIGH SENSITIVITY CRP: CRP, High Sensitivity: 0.53 mg/L (ref 0.00–3.00)

## 2021-12-17 ENCOUNTER — Other Ambulatory Visit: Payer: Self-pay | Admitting: Cardiology

## 2021-12-20 DIAGNOSIS — R7301 Impaired fasting glucose: Secondary | ICD-10-CM | POA: Diagnosis not present

## 2021-12-20 DIAGNOSIS — E785 Hyperlipidemia, unspecified: Secondary | ICD-10-CM | POA: Diagnosis not present

## 2021-12-22 DIAGNOSIS — Z1331 Encounter for screening for depression: Secondary | ICD-10-CM | POA: Diagnosis not present

## 2021-12-22 DIAGNOSIS — I2510 Atherosclerotic heart disease of native coronary artery without angina pectoris: Secondary | ICD-10-CM | POA: Diagnosis not present

## 2021-12-22 DIAGNOSIS — Z0000 Encounter for general adult medical examination without abnormal findings: Secondary | ICD-10-CM | POA: Diagnosis not present

## 2021-12-29 ENCOUNTER — Ambulatory Visit: Payer: BC Managed Care – PPO | Admitting: Cardiology

## 2021-12-29 ENCOUNTER — Encounter: Payer: Self-pay | Admitting: Cardiology

## 2021-12-29 VITALS — BP 158/80 | HR 37 | Ht 74.0 in | Wt 222.2 lb

## 2021-12-29 DIAGNOSIS — E7841 Elevated Lipoprotein(a): Secondary | ICD-10-CM | POA: Diagnosis not present

## 2021-12-29 DIAGNOSIS — N1831 Chronic kidney disease, stage 3a: Secondary | ICD-10-CM

## 2021-12-29 DIAGNOSIS — I129 Hypertensive chronic kidney disease with stage 1 through stage 4 chronic kidney disease, or unspecified chronic kidney disease: Secondary | ICD-10-CM | POA: Diagnosis not present

## 2021-12-29 DIAGNOSIS — E7800 Pure hypercholesterolemia, unspecified: Secondary | ICD-10-CM

## 2021-12-29 DIAGNOSIS — I25118 Atherosclerotic heart disease of native coronary artery with other forms of angina pectoris: Secondary | ICD-10-CM | POA: Diagnosis not present

## 2021-12-29 DIAGNOSIS — I10 Essential (primary) hypertension: Secondary | ICD-10-CM

## 2021-12-29 DIAGNOSIS — I7121 Aneurysm of the ascending aorta, without rupture: Secondary | ICD-10-CM

## 2021-12-29 MED ORDER — AMLODIPINE BESYLATE 5 MG PO TABS
ORAL_TABLET | ORAL | 3 refills | Status: DC
Start: 2021-12-29 — End: 2023-01-13

## 2021-12-29 MED ORDER — ATORVASTATIN CALCIUM 10 MG PO TABS: 10.0000 mg | ORAL_TABLET | Freq: Every day | ORAL | 3 refills | Status: DC

## 2021-12-29 MED ORDER — EVOLOCUMAB 140 MG/ML ~~LOC~~ SOAJ
140.0000 mg | Freq: Once | SUBCUTANEOUS | Status: AC
  Administered 2021-12-29: 140 mg via SUBCUTANEOUS

## 2021-12-29 MED ORDER — REPATHA SURECLICK 140 MG/ML ~~LOC~~ SOAJ: 1.0000 mL | SUBCUTANEOUS | 3 refills | Status: DC

## 2021-12-29 NOTE — Progress Notes (Signed)
Primary Physician/Referring:  Sueanne Margarita, DO  Patient ID: Brandon Short, male    DOB: [birth year], 61 y.o.   MRN: [id number]  Chief Complaint  Patient presents with   Coronary Artery Disease   Follow-up   Hyperlipidemia   HPI:    Brandon Short  is a 61 y.o. Caucasian male patient with history of asymptomatic sinus node dysfunction with marked sinus bradycardia, who exercises fairly regularly and runs at least between 30 to 35 miles a week, hypertension, coronary artery disease with STEMI on 01/16/2021 in the anterolateral wall, underwent stenting to LAD and RCA at [facility] clinic.  He also has history of ureteric stones extracted on 03/06/2021.  This is a 61-month office visit, presently doing well and asymptomatic.  Due to severe myalgias, he reduced the dose of Lipitor from 80 mg to 40 mg but still did not tolerate this, and says discontinued this over the past 2 months.  Also in April 2023, he had an accident and had broken ribs, CT angiogram was performed while in the hospital and was found to have incidental ascending aortic aneurysm measuring 4.4 cm.  Due to myalgias in his legs, patient is a marathon runner and has been having difficulty running. Accompanied by his wife.  Past Medical History:  Diagnosis Date   Bradycardia    Heart attack ([place]) 01/16/2021   After running a 13 mile marathon   Hyperlipidemia    Hypertension    Kidney stones    Past Surgical History:  Procedure Laterality Date   APPENDECTOMY  03/08/2003   KIDNEY STONE SURGERY  02/2021   MENISCUS REPAIR     SHOULDER SURGERY Right 08/2021   Family History  Problem Relation Age of Onset   Cancer - Lung Mother    Prostate cancer Father    Dementia Father    Crohn's disease Sister    Colon cancer Neg Hx    Rectal cancer Neg Hx    Stomach cancer Neg Hx     Social History   Tobacco Use   Smoking status: Never   Smokeless tobacco: Never  Substance Use Topics   Alcohol use: No   Marital  Status: Single  ROS  Review of Systems  Cardiovascular:  Negative for chest pain, dyspnea on exertion and leg swelling.  Gastrointestinal:  Negative for melena.   Objective  Blood pressure (!) 158/80, pulse (!) 37, height $RemoveBe'6\' 2"'sYtwdDSeg$  (1.88 m), weight 222 lb 3.2 oz (100.8 kg), SpO2 99 %. Body mass index is 28.53 kg/m.     12/29/2021   11:33 AM 12/29/2021   11:07 AM 09/30/2021    9:02 AM  Vitals with BMI  Height  $Remov'6\' 2"'TFpAbB$  $Remove'6\' 2"'UnVfEhm$   Weight  222 lbs 3 oz 223 lbs 8 oz  BMI  38.93 73.42  Systolic 876 811 572  Diastolic 80 84 78  Pulse 37 36 40    Physical Exam Neck:     Vascular: No carotid bruit or JVD.  Cardiovascular:     Rate and Rhythm: Regular rhythm. Bradycardia present.     Pulses: Intact distal pulses.     Heart sounds: Murmur heard.     Early systolic murmur is present with a grade of 2/6 at the upper right sternal border.     No gallop.  Pulmonary:     Effort: Pulmonary effort is normal.     Breath sounds: Normal breath sounds.  Abdominal:     General: Bowel sounds are normal.  Palpations: Abdomen is soft.  Musculoskeletal:        General: No swelling.      Laboratory examination:   Recent Labs    03/03/21 2026 06/18/21 0913 08/21/21 2340  NA 142 141 140  K 4.0 4.7 4.2  CL 108 105 107  CO2 $Re'24 21 26  'lsO$ GLUCOSE 112* 90 137*  BUN 31* 24 30*  CREATININE 1.40* 1.33* 1.40*  CALCIUM 9.3 9.5 9.6  GFRNONAA 58*  --  58*   CrCl cannot be calculated (Patient's most recent lab result is older than the maximum 21 days allowed.).     Latest Ref Rng & Units 08/21/2021   11:40 PM 06/18/2021    9:13 AM 03/03/2021    8:26 PM  CMP  Glucose 70 - 99 mg/dL 137  90  112   BUN 6 - 20 mg/dL $Remove'30  24  31   'gIEYlCC$ Creatinine 0.61 - 1.24 mg/dL 1.40  1.33  1.40   Sodium 135 - 145 mmol/L 140  141  142   Potassium 3.5 - 5.1 mmol/L 4.2  4.7  4.0   Chloride 98 - 111 mmol/L 107  105  108   CO2 22 - 32 mmol/L $RemoveB'26  21  24   'YoGiiIJi$ Calcium 8.9 - 10.3 mg/dL 9.6  9.5  9.3   Total Protein 6.5 - 8.1 g/dL   6.6    Total Bilirubin 0.3 - 1.2 mg/dL   0.7   Alkaline Phos 38 - 126 U/L   69   AST 15 - 41 U/L   27   ALT 0 - 44 U/L   32       Latest Ref Rng & Units 08/21/2021   11:40 PM 03/03/2021    8:26 PM 02/11/2021    8:21 AM  CBC  WBC 4.0 - 10.5 K/uL 11.0  10.3  6.2   Hemoglobin 13.0 - 17.0 g/dL 13.7  14.9  15.6   Hematocrit 39.0 - 52.0 % 41.7  42.8  45.3   Platelets 150 - 400 K/uL 219  235  233     Lipid Panel Recent Labs    02/11/21 0821 06/30/21 0942 12/07/21 0847  CHOL 148  --  215*  TRIG 64  --  168*  LDLCALC 70  --  112*  HDL 65  --  74  LDLDIRECT  --  46  --    Lipid Panel     Component Value Date/Time   CHOL 215 (H) 12/07/2021 0847   TRIG 168 (H) 12/07/2021 0847   HDL 74 12/07/2021 0847   LDLCALC 112 (H) 12/07/2021 0847   LDLDIRECT 46 06/30/2021 0942   LABVLDL 29 12/07/2021 0847    Advanced lipid profile labs 02/11/2021:  Apolipoprotein A-1 101 - 178 mg/dL 170  Apolipoprotein B <90 mg/dL  64   Apolipo. B/A-1 Ratio 0.0 - 0.7 ratio  0.4  Normal.   CRP, High Sensitivity 0.00 - 3.00 mg/L 0.42   Lipoprotein (a) <75.0 nmol/L    134.0 High    External labs:   Labs 01/18/2021: Total cholesterol 184, triglycerides 114, HDL 67, LDL 96.  Serum glucose 95 mg, BUN 19, creatinine 0.89, EGFR >60 mL, potassium 4.1.  ALT 27, AST 114.  Peak serum troponin 3614.  Hb 13.2/HCT 39.1, platelets 215.  A1C 4.900 % 12/17/2020 TSH 1.130 12/17/2020  Labs 11/27/2019: CHOLESTEROL 231   100 - 200  TRIGLYCERIDES 72   30 - 149  HDL 74   40 - 60  LDL 143   <  CHOL/HDL RATIO 3.1   <  LDL/HDL RATIO 1.9   1.7 - 2.5  NON-HDL 157.0   <   Medications and allergies  No Known Allergies    Medication list      Current Outpatient Medications:    aspirin 81 MG chewable tablet, CHEW AND SWALLOW 1 TABLET(81 MG) BY MOUTH DAILY (Patient taking differently: Chew 81 mg by mouth once.), Disp: 30 tablet, Rfl: 3   Cholecalciferol (D3-1000) 25 MCG (1000 UT) capsule, Take 1,000 Units by mouth  daily., Disp: , Rfl:    Evolocumab (REPATHA SURECLICK) 546 MG/ML SOAJ, Inject 140 mg into the skin every 14 (fourteen) days., Disp: 6 mL, Rfl: 3   L-Lysine 500 MG TABS, Take 1 tablet by mouth daily., Disp: , Rfl:    losartan (COZAAR) 100 MG tablet, Take 1 tablet (100 mg total) by mouth daily., Disp: 90 tablet, Rfl: 3   Magnesium 80 MG TABS, Take by mouth., Disp: , Rfl:    nitroGLYCERIN (NITROSTAT) 0.4 MG SL tablet, Place 1 tablet under the tongue as needed for chest pain., Disp: , Rfl:    amLODipine (NORVASC) 5 MG tablet, TAKE 1 TABLET(5 MG) BY MOUTH DAILY, Disp: 90 tablet, Rfl: 3   atorvastatin (LIPITOR) 10 MG tablet, Take 1 tablet (10 mg total) by mouth daily., Disp: 90 tablet, Rfl: 3   Radiology:   Chest x-ray PA and lateral view 01/17/2021: The lungs are clear.  The cardiac silhouette is stable.  There is no  pneumothorax or pleural effusion.  CTA Chest 08/22/2021:  1. Aortic dissection protocol with no acute vascular injury identified in the chest. Ascending thoracic aortic aneurysm up to 4.4 cm diameter. Recommend annual imaging followup by CTA or MRA. 2. Positive for acute rib fractures on the right ribs 2 through 6.4th and 5th ribs are fractured in two places. Trace right pleural fluid. Small right lung contusion. No pneumothorax. 3. Comminuted right clavicle shaft fracture and minimally displaced superior right scapula fracture. Mild hematoma and contusion in the right axilla.  4. Age indeterminate mild thoracic vertebral compression fractures. Of these, a mild T2 superior endplate fracture is most suspicious for acute injury.  Cardiac Studies:   Coronary Angiogram 01/17/2021 patent stents 01/19/2021: LMCA: Normal 0% stenosis.  LAD: Mid 80% stenosis with an ulcerated plaque, reduced to 0% with PTCA/DES with Resolute Onyx 3.0 x 18 DES.Marland Kitchen Apical LAD is small caliber with diffuse 50-60% stenosis  Diagonals are small and patent   LCx: Mild irregularities 20-30%.OM 1 is small and patent   OM 2 has very distal 80% stenosis (small caliber vessel)  RCA: Mild irregularities 10-20%.Large and dominant  RPL is large with proximal 75% eccentric stenosis, reduced to 0% with Resolute Onyx 3.0 x 15 DES. RPDA is large with mid 30-40% stenosis.  PCV ECHOCARDIOGRAM COMPLETE 04/02/2021  Left ventricle cavity is normal in size and wall thickness. Normal global wall motion. Normal LV systolic function with EF 65%. Normal diastolic filling pattern. Left atrial cavity is mildly dilated. Structurally normal trileaflet aortic valve. Mild to moderate aortic regurgitation. Mildly increased aortic valve velocity likely due to aortic regurgitation. No evidence of aortic stenosis. Mild (Grade I) mitral regurgitation. Mild tricuspid regurgitation. Mild pulmonic regurgitation. No evidence of pulmonary hypertension. No evidence of VSD. Murmur likely due to above valvular abnormalities. No significant change compared to external echocardiogram report dated 01/19/2021.     EKG:   EKG 12/29/2021: Marked sinus bradycardia with first-degree AV block at  rate of 33 bpm, Rightward axis, poor R progression, cannot exclude anteroseptal infarct old.  Normal QT interval, no evidence of ischemia.  Compared to 01/25/2021, no change including the heart rate.  Assessment     ICD-10-CM   1. Coronary artery disease involving native coronary artery of native heart with other form of angina pectoris (HCC)  I25.118 EKG 12-Lead    Evolocumab (REPATHA SURECLICK) 916 MG/ML SOAJ    atorvastatin (LIPITOR) 10 MG tablet    2. Primary hypertension  I10 amLODipine (NORVASC) 5 MG tablet    3. Hypercholesteremia  E78.00 Evolocumab (REPATHA SURECLICK) 945 MG/ML SOAJ    atorvastatin (LIPITOR) 10 MG tablet    Lipid Panel With LDL/HDL Ratio    LDL cholesterol, direct    High sensitivity CRP    Evolocumab SOAJ 140 mg    4. Elevated Lp(a)  E78.41 Evolocumab (REPATHA SURECLICK) 038 MG/ML SOAJ    5. Aneurysm of ascending aorta  without rupture (HCC)  I71.21     6. Stage 3a chronic kidney disease ([place])  N18.31       Orders Placed This Encounter  Procedures   Lipid Panel With LDL/HDL Ratio   LDL cholesterol, direct   High sensitivity CRP   EKG 12-Lead   Recommendations:   Brandon Short is a 61 y.o. Caucasian male patient with history of asymptomatic sinus node dysfunction with marked sinus bradycardia, who exercises fairly regularly and runs at least between 30 to 35 miles a week, hypertension, coronary artery disease with STEMI on 01/16/2021 in the anterolateral wall, underwent stenting to LAD and RCA at [facility] clinic.  He also has history of ureteric stones extracted on 03/06/2021.  This is a 74-month office visit, presently doing well and asymptomatic.  Due to severe myalgias, he reduced the dose of Lipitor from 80 mg to 40 mg but still did not tolerate this, and says discontinued this over the past 2 months.  Also in April 2023, he had an accident and had broken ribs, CT angiogram was performed while in the hospital and was found to have incidental ascending aortic aneurysm measuring 4.4 cm.  I had to spend time reconciling his medications.  As his blood pressure is now increased again, I do not know why amlodipine was discontinued, I have restarted this.  I would like to repeat EKG to follow-up on bradycardia as amlodipine can rarely cause worsening bradycardia although he is completely asymptomatic and is a marathon runner heart rate is around 39 to 40 bpm.  Importance of blood pressure control goal blood pressure 130/80 mmHg or less in fact would like to have it around 125/70 mmHg in view of coronary artery disease, ascending aortic aneurysm.  Lipids need to be controlled, LDL has risen again as he has discontinued statins.  He is unable to tolerate Lipitor at a high dose, will reinitiate Lipitor at 10 mg daily, discontinue Zetia and start him on Repatha also in view of elevated LPA.  In view of ascending  aortic aneurysm which I reviewed the CT scan, discussed with the patient and his wife, I will repeat echocardiogram to follow-up on mild to moderate aortic regurgitation and ascending aortic aneurysm to see whether we can correlate his aneurysmal size.  I would like to see him back in 2 months for follow-up of hypertension, bradycardia and also discuss echocardiogram.  All questions answered, 40-minute office visit encounter.   Administration Action Time Recorded Time Documented By Site Comment Reason Patient Supplied  Given :  140 mg :   : Subcutaneous 12/29/21 1155 12/29/21 1157 Mares, Lesley Right Lower Abdomen NDC: 48546-270-35  LOT: [id number]  EXP: 06/04/2024  No      Adrian Prows, MD, [facility] 12/29/2021, 12:10 PM Office: [phone]

## 2021-12-31 ENCOUNTER — Other Ambulatory Visit: Payer: Self-pay

## 2021-12-31 DIAGNOSIS — I25118 Atherosclerotic heart disease of native coronary artery with other forms of angina pectoris: Secondary | ICD-10-CM

## 2021-12-31 DIAGNOSIS — E7800 Pure hypercholesterolemia, unspecified: Secondary | ICD-10-CM

## 2021-12-31 DIAGNOSIS — E7841 Elevated Lipoprotein(a): Secondary | ICD-10-CM

## 2021-12-31 MED ORDER — REPATHA SURECLICK 140 MG/ML ~~LOC~~ SOAJ: 1.0000 mL | SUBCUTANEOUS | 3 refills | Status: DC

## 2022-01-07 ENCOUNTER — Other Ambulatory Visit: Payer: Self-pay

## 2022-01-07 DIAGNOSIS — E7841 Elevated Lipoprotein(a): Secondary | ICD-10-CM

## 2022-01-07 DIAGNOSIS — I25118 Atherosclerotic heart disease of native coronary artery with other forms of angina pectoris: Secondary | ICD-10-CM

## 2022-01-07 DIAGNOSIS — E7800 Pure hypercholesterolemia, unspecified: Secondary | ICD-10-CM

## 2022-01-10 DIAGNOSIS — H5203 Hypermetropia, bilateral: Secondary | ICD-10-CM | POA: Diagnosis not present

## 2022-01-14 ENCOUNTER — Ambulatory Visit (AMBULATORY_SURGERY_CENTER): Payer: BC Managed Care – PPO | Admitting: *Deleted

## 2022-01-14 ENCOUNTER — Other Ambulatory Visit: Payer: Self-pay | Admitting: Cardiology

## 2022-01-14 ENCOUNTER — Other Ambulatory Visit: Payer: Self-pay

## 2022-01-14 VITALS — Ht 74.0 in | Wt 215.0 lb

## 2022-01-14 DIAGNOSIS — Z1211 Encounter for screening for malignant neoplasm of colon: Secondary | ICD-10-CM

## 2022-01-14 MED ORDER — NA SULFATE-K SULFATE-MG SULF 17.5-3.13-1.6 GM/177ML PO SOLN: 1.0000 | Freq: Once | ORAL | 0 refills | Status: AC

## 2022-01-14 NOTE — Progress Notes (Signed)
Pre visit completed in person.  Instructions forwarded through MyChart  No egg or soy allergy known to patient  No issues known to pt with past sedation with any surgeries or procedures Patient denies ever being told they had issues or difficulty with intubation  No FH of Malignant Hyperthermia Pt is not on diet pills Pt is not on  home 02  Pt is not on blood thinners  Pt denies issues with constipation  No A fib or A flutter  Pt instructed to use [email or web address] or GoodRx for a price reduction on prep

## 2022-01-16 ENCOUNTER — Encounter: Payer: Self-pay | Admitting: Cardiology

## 2022-01-26 ENCOUNTER — Encounter: Payer: Self-pay | Admitting: Internal Medicine

## 2022-02-02 ENCOUNTER — Encounter: Payer: Self-pay | Admitting: Internal Medicine

## 2022-02-02 ENCOUNTER — Ambulatory Visit (AMBULATORY_SURGERY_CENTER): Payer: BC Managed Care – PPO | Admitting: Internal Medicine

## 2022-02-02 VITALS — BP 130/72 | HR 38 | Temp 97.8°F | Resp 18 | Ht 74.0 in | Wt 220.6 lb

## 2022-02-02 DIAGNOSIS — K635 Polyp of colon: Secondary | ICD-10-CM | POA: Diagnosis not present

## 2022-02-02 DIAGNOSIS — Z1211 Encounter for screening for malignant neoplasm of colon: Secondary | ICD-10-CM | POA: Diagnosis not present

## 2022-02-02 DIAGNOSIS — D120 Benign neoplasm of cecum: Secondary | ICD-10-CM | POA: Diagnosis not present

## 2022-02-02 MED ORDER — SODIUM CHLORIDE 0.9 % IV SOLN: 500.0000 mL | Freq: Once | INTRAVENOUS | Status: DC

## 2022-02-02 NOTE — Progress Notes (Signed)
GASTROENTEROLOGY PROCEDURE H&P NOTE   Primary Care Physician: Sueanne Margarita, DO    Reason for Procedure:   Colon cancer screening  Plan:    Colonoscopy  Patient is appropriate for endoscopic procedure(s) in the ambulatory ([place]) setting.  The nature of the procedure, as well as the risks, benefits, and alternatives were carefully and thoroughly reviewed with the patient. Ample time for discussion and questions allowed. The patient understood, was satisfied, and agreed to proceed.     HPI: Brandon Short is a 61 y.o. male who presents for colonoscopy for evaluation of colon cancer screening .  Patient was most recently seen in the Gastroenterology Clinic on 09/30/21.  No interval change in medical history since that appointment. Please refer to that note for full details regarding GI history and clinical presentation.   Past Medical History:  Diagnosis Date   Bradycardia    Heart attack ([place]) 01/16/2021   After running a 13 mile marathon   Hyperlipidemia    Hypertension    Kidney stones     Past Surgical History:  Procedure Laterality Date   APPENDECTOMY  03/08/2003   COLONOSCOPY     KIDNEY STONE SURGERY  02/2021   MENISCUS REPAIR     SHOULDER SURGERY Bilateral 08/2021    Prior to Admission medications   Medication Sig Start Date End Date Taking? Authorizing Provider  amLODipine (NORVASC) 5 MG tablet TAKE 1 TABLET(5 MG) BY MOUTH DAILY 12/29/21  Yes Adrian Prows, MD  aspirin 81 MG chewable tablet CHEW AND SWALLOW 1 TABLET(81 MG) BY MOUTH DAILY 01/17/22  Yes Adrian Prows, MD  atorvastatin (LIPITOR) 10 MG tablet Take 1 tablet (10 mg total) by mouth daily. 12/29/21  Yes Adrian Prows, MD  Cholecalciferol (D3-1000) 25 MCG (1000 UT) capsule Take 1,000 Units by mouth daily.   Yes [provider]  Evolocumab (REPATHA SURECLICK) 097 MG/ML SOAJ Inject 140 mg into the skin every 14 (fourteen) days. 12/31/21  Yes Adrian Prows, MD  L-Lysine 500 MG TABS Take 1 tablet by mouth  daily. 09/27/19  Yes [provider]  losartan (COZAAR) 50 MG tablet Take 50 mg by mouth daily. 01/23/22  Yes [provider]  Magnesium 80 MG TABS Take by mouth.   Yes [provider]  nitroGLYCERIN (NITROSTAT) 0.4 MG SL tablet Place 1 tablet under the tongue as needed for chest pain. Patient not taking: Reported on 01/14/2022 01/20/21   [provider]    Current Outpatient Medications  Medication Sig Dispense Refill   amLODipine (NORVASC) 5 MG tablet TAKE 1 TABLET(5 MG) BY MOUTH DAILY 90 tablet 3   aspirin 81 MG chewable tablet CHEW AND SWALLOW 1 TABLET(81 MG) BY MOUTH DAILY 30 tablet 3   atorvastatin (LIPITOR) 10 MG tablet Take 1 tablet (10 mg total) by mouth daily. 90 tablet 3   Cholecalciferol (D3-1000) 25 MCG (1000 UT) capsule Take 1,000 Units by mouth daily.     Evolocumab (REPATHA SURECLICK) 353 MG/ML SOAJ Inject 140 mg into the skin every 14 (fourteen) days. 6 mL 3   L-Lysine 500 MG TABS Take 1 tablet by mouth daily.     losartan (COZAAR) 50 MG tablet Take 50 mg by mouth daily.     Magnesium 80 MG TABS Take by mouth.     nitroGLYCERIN (NITROSTAT) 0.4 MG SL tablet Place 1 tablet under the tongue as needed for chest pain. (Patient not taking: Reported on 01/14/2022)     Current Facility-Administered Medications  Medication Dose Route  Frequency Provider Last Rate Last Admin   0.9 %  sodium chloride infusion  500 mL Intravenous Once Sharyn Creamer, MD        Allergies as of 02/02/2022   (No Known Allergies)    Family History  Problem Relation Age of Onset   Cancer - Lung Mother    Prostate cancer Father    Dementia Father    Crohn's disease Sister    Colon cancer Neg Hx    Rectal cancer Neg Hx    Stomach cancer Neg Hx    Esophageal cancer Neg Hx     Social History   Socioeconomic History   Marital status: Married    Spouse name: Not on file   Number of children: 3   Years of education: Not on file   Highest education level: Not  on file  Occupational History   Not on file  Tobacco Use   Smoking status: Never   Smokeless tobacco: Never  Vaping Use   Vaping Use: Never used  Substance and Sexual Activity   Alcohol use: No   Drug use: No   Sexual activity: Not on file  Other Topics Concern   Not on file  Social History Narrative   Not on file   Social Determinants of Health   Financial Resource Strain: Not on file  Food Insecurity: Not on file  Transportation Needs: Not on file  Physical Activity: Not on file  Stress: Not on file  Social Connections: Not on file  Intimate Partner Violence: Not on file    Physical Exam: Vital signs in last 24 hours: BP 124/71   Pulse (!) 42   Temp 97.8 F (36.6 C)   Ht '6\' 2"'$  (1.88 m)   Wt 220 lb 9.6 oz (100.1 kg)   SpO2 98%   BMI 28.32 kg/m  GEN: NAD EYE: Sclerae anicteric ENT: MMM CV: Non-tachycardic Pulm: No increased WOB GI: Soft NEURO:  Alert & Oriented   Christia Reading, MD [facility]   02/02/2022 1:53 PM

## 2022-02-02 NOTE — Patient Instructions (Addendum)
-   Discharge patient to home (with escort). - Await pathology results. - The findings and recommendations were discussed with the patient.  Handouts on polyps and hemorrhoids given.   YOU HAD AN ENDOSCOPIC PROCEDURE TODAY AT [facility]:   Refer to the procedure report that was given to you for any specific questions about what was found during the examination.  If the procedure report does not answer your questions, please call your gastroenterologist to clarify.  If you requested that your care partner not be given the details of your procedure findings, then the procedure report has been included in a sealed envelope for you to review at your convenience later.  YOU SHOULD EXPECT: Some feelings of bloating in the abdomen. Passage of more gas than usual.  Walking can help get rid of the air that was put into your GI tract during the procedure and reduce the bloating. If you had a lower endoscopy (such as a colonoscopy or flexible sigmoidoscopy) you may notice spotting of blood in your stool or on the toilet paper. If you underwent a bowel prep for your procedure, you may not have a normal bowel movement for a few days.  Please Note:  You might notice some irritation and congestion in your nose or some drainage.  This is from the oxygen used during your procedure.  There is no need for concern and it should clear up in a day or so.  SYMPTOMS TO REPORT IMMEDIATELY:  Following lower endoscopy (colonoscopy or flexible sigmoidoscopy):  Excessive amounts of blood in the stool  Significant tenderness or worsening of abdominal pains  Swelling of the abdomen that is new, acute  Fever of 100F or higher  For urgent or emergent issues, a gastroenterologist can be reached at any hour by calling [phone]. Do not use MyChart messaging for urgent concerns.    DIET:  We do recommend a small meal at first, but then you may proceed to your regular diet.  Drink plenty of fluids but you  should avoid alcoholic beverages for 24 hours.  ACTIVITY:  You should plan to take it easy for the rest of today and you should NOT DRIVE or use heavy machinery until tomorrow (because of the sedation medicines used during the test).    FOLLOW UP: Our staff will call the number listed on your records the next business day following your procedure.  We will call around 7:15- 8:00 am to check on you and address any questions or concerns that you may have regarding the information given to you following your procedure. If we do not reach you, we will leave a message.     If any biopsies were taken you will be contacted by phone or by letter within the next 1-3 weeks.  Please call us at [phone] if you have not heard about the biopsies in 3 weeks.    SIGNATURES/CONFIDENTIALITY: You and/or your care partner have signed paperwork which will be entered into your electronic medical record.  These signatures attest to the fact that that the information above on your After Visit Summary has been reviewed and is understood.  Full responsibility of the confidentiality of this discharge information lies with you and/or your care-partner.

## 2022-02-02 NOTE — Progress Notes (Signed)
Pt's states no medical or surgical changes since previsit or office visit. 

## 2022-02-02 NOTE — Progress Notes (Signed)
Called to room to assist during endoscopic procedure.  Patient ID and intended procedure confirmed with present staff. Received instructions for my participation in the procedure from the performing physician.  

## 2022-02-02 NOTE — Op Note (Signed)
[facility] Patient Name: Maxim Bedel Procedure Date: 02/02/2022 2:26 PM MRN: [id number] Endoscopist: Georgian Co , , [id number] Age: 61 Referring MD:  Date of Birth: September 24, 1960 Gender: Male Account #: [id number] Procedure:                Colonoscopy Indications:              Screening for colorectal malignant neoplasm Medicines:                Monitored Anesthesia Care Procedure:                Pre-Anesthesia Assessment:                           - Prior to the procedure, a History and Physical                            was performed, and patient medications and                            allergies were reviewed. The patient's tolerance of                            previous anesthesia was also reviewed. The risks                            and benefits of the procedure and the sedation                            options and risks were discussed with the patient.                            All questions were answered, and informed consent                            was obtained. Prior Anticoagulants: The patient has                            taken no anticoagulant or antiplatelet agents. ASA                            Grade Assessment: II - A patient with mild systemic                            disease. After reviewing the risks and benefits,                            the patient was deemed in satisfactory condition to                            undergo the procedure.                           After obtaining informed consent, the colonoscope  was passed under direct vision. Throughout the                            procedure, the patient's blood pressure, pulse, and                            oxygen saturations were monitored continuously. The                            CF [id number] #[id number] was introduced through the anus                            and advanced to the the terminal ileum. The                            colonoscopy  was performed without difficulty. The                            patient tolerated the procedure well. The quality                            of the bowel preparation was good. The terminal                            ileum, ileocecal valve, appendiceal orifice, and                            rectum were photographed. Scope In: 2:30:42 PM Scope Out: 1:76:16 PM Scope Withdrawal Time: 0 hours 12 minutes 11 seconds  Total Procedure Duration: 0 hours 16 minutes 1 second  Findings:                 The terminal ileum appeared normal.                           A 3 mm polyp was found in the cecum. The polyp was                            sessile. The polyp was removed with a cold snare.                            Resection and retrieval were complete.                           Non-bleeding internal hemorrhoids were found during                            retroflexion. Complications:            No immediate complications. Estimated Blood Loss:     Estimated blood loss was minimal. Impression:               - The examined portion of the ileum was normal.                           -  One 3 mm polyp in the cecum, removed with a cold                            snare. Resected and retrieved.                           - Non-bleeding internal hemorrhoids. Recommendation:           - Discharge patient to home (with escort).                           - Await pathology results.                           - The findings and recommendations were discussed                            with the patient. Dr Georgian Co "Lyndee Leo" Lorenso Courier,  02/02/2022 2:48:38 PM

## 2022-02-02 NOTE — Progress Notes (Signed)
A and O x3. Report to RN. Tolerated MAC anesthesia well. 

## 2022-02-03 ENCOUNTER — Telehealth: Payer: Self-pay

## 2022-02-03 NOTE — Telephone Encounter (Signed)
Post procedure follow up call, no answer 

## 2022-02-07 ENCOUNTER — Ambulatory Visit: Payer: BC Managed Care – PPO

## 2022-02-07 DIAGNOSIS — I7121 Aneurysm of the ascending aorta, without rupture: Secondary | ICD-10-CM

## 2022-02-07 DIAGNOSIS — I25118 Atherosclerotic heart disease of native coronary artery with other forms of angina pectoris: Secondary | ICD-10-CM | POA: Diagnosis not present

## 2022-02-09 ENCOUNTER — Encounter: Payer: Self-pay | Admitting: Internal Medicine

## 2022-02-22 ENCOUNTER — Ambulatory Visit: Payer: BC Managed Care – PPO | Admitting: Cardiology

## 2022-02-22 ENCOUNTER — Encounter: Payer: Self-pay | Admitting: Cardiology

## 2022-02-22 VITALS — BP 123/73 | HR 64 | Resp 16 | Ht 74.0 in | Wt 227.8 lb

## 2022-02-22 DIAGNOSIS — I25118 Atherosclerotic heart disease of native coronary artery with other forms of angina pectoris: Secondary | ICD-10-CM | POA: Diagnosis not present

## 2022-02-22 DIAGNOSIS — I10 Essential (primary) hypertension: Secondary | ICD-10-CM | POA: Diagnosis not present

## 2022-02-22 DIAGNOSIS — E7800 Pure hypercholesterolemia, unspecified: Secondary | ICD-10-CM | POA: Diagnosis not present

## 2022-02-22 DIAGNOSIS — I7121 Aneurysm of the ascending aorta, without rupture: Secondary | ICD-10-CM | POA: Diagnosis not present

## 2022-02-22 NOTE — Progress Notes (Signed)
Primary Physician/Referring:  Sueanne Margarita, DO  Patient ID: Brandon Short, male    DOB: 13-Feb-1961, 61 y.o.   MRN: [id number]  Chief Complaint  Patient presents with   Hypertension   AAA   Follow-up   HPI:    Brandon Short  is a 61 y.o. Caucasian male patient with history of asymptomatic sinus node dysfunction with marked sinus bradycardia, who exercises fairly regularly and runs at least between 30 to 35 miles a week, hypertension, stage IIIa chronic kidney disease, coronary artery disease with STEMI on 01/16/2021 in the anterolateral wall with stenting to LAD and RCA at [facility] clinic.  He also has history of ureteric stones extracted on 03/06/2021.  This is a 6-week office visit, presently doing well and asymptomatic.  Due to severe myalgias, he reduced the dose of Lipitor from 80 mg to 40 mg but still did not tolerate this, on his last office visit had started him on 10 mg of Lipitor and initiated Repatha.    Also in April 2023, he had an accident and had broken ribs, CT angiogram was performed while in the hospital and was found to have incidental ascending aortic aneurysm measuring 4.4 cm.  Underwent echocardiogram and presents for follow-up.  He is tolerating Repatha.  Past Medical History:  Diagnosis Date   Bradycardia    Heart attack ([place]) 01/16/2021   After running a 13 mile marathon   Hyperlipidemia    Hypertension    Kidney stones    Past Surgical History:  Procedure Laterality Date   APPENDECTOMY  03/08/2003   COLONOSCOPY     KIDNEY STONE SURGERY  02/2021   MENISCUS REPAIR     SHOULDER SURGERY Bilateral 08/2021   Family History  Problem Relation Age of Onset   Cancer - Lung Mother    Prostate cancer Father    Dementia Father    Crohn's disease Sister    Colon cancer Neg Hx    Rectal cancer Neg Hx    Stomach cancer Neg Hx    Esophageal cancer Neg Hx     Social History   Tobacco Use   Smoking status: Never   Smokeless tobacco: Never   Substance Use Topics   Alcohol use: No   Marital Status: Single  ROS  Review of Systems  Cardiovascular:  Negative for chest pain, dyspnea on exertion and leg swelling.  Gastrointestinal:  Negative for melena.   Objective  Blood pressure 123/73, pulse 64, resp. rate 16, height _0  (1.88 m), weight 227 lb 12.8 oz (103.3 kg), SpO2 98 %. Body mass index is 29.25 kg/m.     02/22/2022    8:54 AM 02/02/2022    3:10 PM 02/02/2022    3:00 PM  Vitals with BMI  Height _1     Weight 227 lbs 13 oz    BMI 48.88    Systolic 916 945 038  Diastolic 73 72 71  Pulse 64 38 38    Physical Exam Neck:     Vascular: No carotid bruit or JVD.  Cardiovascular:     Rate and Rhythm: Regular rhythm. Bradycardia present.     Pulses: Intact distal pulses.     Heart sounds: Murmur heard.     Early systolic murmur is present with a grade of 2/6 at the upper right sternal border.     No gallop.  Pulmonary:     Effort: Pulmonary effort is normal.     Breath sounds: Normal breath sounds.  Abdominal:     General: Bowel sounds are normal.     Palpations: Abdomen is soft.  Musculoskeletal:        General: No swelling.     Laboratory examination:   Recent Labs    03/03/21 2026 06/18/21 0913 08/21/21 2340  NA 142 141 140  K 4.0 4.7 4.2  CL 108 105 107  CO2 _0 GLUCOSE 112* 90 137*  BUN 31* 24 30*  CREATININE 1.40* 1.33* 1.40*  CALCIUM 9.3 9.5 9.6  GFRNONAA 58*  --  58*      Latest Ref Rng & Units 08/21/2021   11:40 PM 06/18/2021    9:13 AM 03/03/2021    8:26 PM  CMP  Glucose 70 - 99 mg/dL 137  90  112   BUN 6 - 20 mg/dL _1 Creatinine 0.61 - 1.24 mg/dL 1.40  1.33  1.40   Sodium 135 - 145 mmol/L 140  141  142   Potassium 3.5 - 5.1 mmol/L 4.2  4.7  4.0   Chloride 98 - 111 mmol/L 107  105  108   CO2 22 - 32 mmol/L _2 Calcium 8.9 - 10.3 mg/dL 9.6  9.5  9.3   Total Protein 6.5 - 8.1 g/dL   6.6   Total Bilirubin 0.3 - 1.2 mg/dL   0.7   Alkaline Phos 38 - 126  U/L   69   AST 15 - 41 U/L   27   ALT 0 - 44 U/L   32       Latest Ref Rng & Units 08/21/2021   11:40 PM 03/03/2021    8:26 PM 02/11/2021    8:21 AM  CBC  WBC 4.0 - 10.5 K/uL 11.0  10.3  6.2   Hemoglobin 13.0 - 17.0 g/dL 13.7  14.9  15.6   Hematocrit 39.0 - 52.0 % 41.7  42.8  45.3   Platelets 150 - 400 K/uL 219  235  233     Lipid Panel Recent Labs    06/30/21 0942 12/07/21 0847  CHOL  --  215*  TRIG  --  168*  LDLCALC  --  112*  HDL  --  74  LDLDIRECT 46  --   . Advanced lipid profile labs 02/11/2021:  Apolipoprotein A-1 101 - 178 mg/dL 170  Apolipoprotein B <90 mg/dL  64   Apolipo. B/A-1 Ratio 0.0 - 0.7 ratio  0.4  Normal.   CRP, High Sensitivity 0.00 - 3.00 mg/L 0.42   Lipoprotein (a) <75.0 nmol/L    134.0 High    External labs:   Labs 01/18/2021: Total cholesterol 184, triglycerides 114, HDL 67, LDL 96.  Serum glucose 95 mg, BUN 19, creatinine 0.89, EGFR >60 mL, potassium 4.1.  ALT 27, AST 114.  Peak serum troponin 3614.  Hb 13.2/HCT 39.1, platelets 215.  A1C 4.900 % 12/17/2020 TSH 1.130 12/17/2020  Medications and allergies  No Known Allergies    Medication list      Current Outpatient Medications:    amLODipine (NORVASC) 5 MG tablet, TAKE 1 TABLET(5 MG) BY MOUTH DAILY, Disp: 90 tablet, Rfl: 3   aspirin 81 MG chewable tablet, CHEW AND SWALLOW 1 TABLET(81 MG) BY MOUTH DAILY, Disp: 30 tablet, Rfl: 3   atorvastatin (LIPITOR) 10 MG tablet, Take 1 tablet (10 mg total) by mouth daily., Disp: 90 tablet, Rfl: 3   Cholecalciferol (D3-1000) 25 MCG (1000 UT) capsule,  Take 1,000 Units by mouth daily., Disp: , Rfl:    Evolocumab (REPATHA SURECLICK) 161 MG/ML SOAJ, Inject 140 mg into the skin every 14 (fourteen) days., Disp: 6 mL, Rfl: 3   L-Lysine 500 MG TABS, Take 1 tablet by mouth daily., Disp: , Rfl:    losartan (COZAAR) 50 MG tablet, Take 50 mg by mouth daily., Disp: , Rfl:    Magnesium 80 MG TABS, Take by mouth., Disp: , Rfl:    nitroGLYCERIN (NITROSTAT)  0.4 MG SL tablet, Place 1 tablet under the tongue as needed for chest pain., Disp: , Rfl:    Radiology:   Chest x-ray PA and lateral view 01/17/2021: The lungs are clear.  The cardiac silhouette is stable.  There is no  pneumothorax or pleural effusion.  CTA Chest 08/22/2021:  1. Aortic dissection protocol with no acute vascular injury identified in the chest. Ascending thoracic aortic aneurysm up to 4.4 cm diameter. Recommend annual imaging followup by CTA or MRA. 2. Positive for acute rib fractures on the right ribs 2 through 6.4th and 5th ribs are fractured in two places. Trace right pleural fluid. Small right lung contusion. No pneumothorax. 3. Comminuted right clavicle shaft fracture and minimally displaced superior right scapula fracture. Mild hematoma and contusion in the right axilla.  4. Age indeterminate mild thoracic vertebral compression fractures. Of these, a mild T2 superior endplate fracture is most suspicious for acute injury.  Cardiac Studies:   Coronary Angiogram 01/17/2021 patent stents 01/19/2021: LMCA: Normal 0% stenosis.  LAD: Mid 80% stenosis with an ulcerated plaque, reduced to 0% with PTCA/DES with Resolute Onyx 3.0 x 18 DES. Apical LAD is small caliber with diffuse 50-60% stenosis  Diagonals are small and patent   LCx: Mild irregularities 20-30%.OM 1 is small and patent  OM 2 has very distal 80% stenosis (small caliber vessel)  RCA: Mild irregularities 10-20%.Large and dominant  RPL is large with proximal 75% eccentric stenosis, reduced to 0% with Resolute Onyx 3.0 x 15 DES. RPDA is large with mid 30-40% stenosis.  PCV ECHOCARDIOGRAM COMPLETE 02/07/2022  Narrative Echocardiogram 02/09/2022: Normal LV systolic function with visual EF 55-60%. Left ventricle cavity is normal in size. Mild concentric hypertrophy of the left ventricle. Normal global wall motion. Normal diastolic filling pattern, normal LAP. Calculated EF 58%. Left atrial cavity is mildly  dilated. Structurally normal trileaflet aortic valve.  Moderate (Grade II) aortic regurgitation. Structurally normal mitral valve.  Mild (Grade I) mitral regurgitation. Structurally normal tricuspid valve.  Mild tricuspid regurgitation. No evidence of pulmonary hypertension. Structurally normal pulmonic valve.  Mild pulmonic regurgitation. The aortic root is normal. Moderately dilated ascending aorta at 4.6 cm. IVC is dilated with respiratory variation. No significant change compared to 03/2021 but Asc Ao is now dilated.     EKG:   EKG 02/22/2022: Mild sinus bradycardia with sustained AV block with rate of 41 bpm, left anterior fascicular block, cannot exclude inferior infarct old.  Poor R progression, cannot exclude anteroseptal infarct old.  Normal QT interval.  Compared to 12/29/2021, no significant change.  Assessment     ICD-10-CM   1. Aneurysm of ascending aorta without rupture (HCC)  I71.21 EKG 12-Lead    PCV ECHOCARDIOGRAM COMPLETE    2. Hypercholesteremia  E78.00 Lipid Panel With LDL/HDL Ratio    Lipoprotein A (LPA)    3. Coronary artery disease involving native coronary artery of native heart with other form of angina pectoris ([place])  I25.118 PCV ECHOCARDIOGRAM COMPLETE    Lipid Panel With  LDL/HDL Ratio    Lipoprotein A (LPA)    4. Primary hypertension  I10       Orders Placed This Encounter  Procedures   Lipid Panel With LDL/HDL Ratio   Lipoprotein A (LPA)   EKG 12-Lead   PCV ECHOCARDIOGRAM COMPLETE    4.6 cm Asc Ao    Standing Status:   Future    Standing Expiration Date:   02/23/2023   Recommendations:   Brandon Short is a 61 y.o. Caucasian male patient with history of asymptomatic sinus node dysfunction with marked sinus bradycardia, who exercises fairly regularly and runs at least between 30 to 35 miles a week, hypertension, stage IIIa chronic kidney disease, coronary artery disease with STEMI on 01/16/2021 in the anterolateral wall with stenting to LAD and  RCA at [facility] clinic.  He also has history of ureteric stones extracted on 03/06/2021.  This is a 6-week office visit, presently doing well and asymptomatic.    1. Aneurysm of ascending aorta without rupture (HCC) Ascending aorta counseling is no well-documented on the echocardiogram, no change in size from June 2023 CT scan versus 02/07/2022 echocardiogram, 4.5 cm.  He is on losartan, continue the same.  I will recheck echocardiogram in 1 year.  2. Hypercholesteremia Patient was started on Repatha both in view of mildly elevated LPA and LDL not at goal closer to 75 in view of relatively young age in a patient with myocardial infarction recently.  Will check lipids.  3. Coronary artery disease involving native coronary artery of native heart with other form of angina pectoris [facility]) He has not had any recurrence of angina pectoris.  As it has been 1 year since dual antiplatelet therapy, Brilinta was discontinued, presently on aspirin indefinitely.  4. Primary hypertension Blood pressure is well-controlled.  No changes in the medications were done today.  I will see him back in a year or sooner if problems.    Adrian Prows, MD, [facility] 02/22/2022, 9:22 AM Office: [phone]

## 2022-04-18 ENCOUNTER — Other Ambulatory Visit: Payer: Self-pay | Admitting: Cardiology

## 2022-04-27 ENCOUNTER — Other Ambulatory Visit: Payer: Self-pay | Admitting: Cardiology

## 2022-05-26 ENCOUNTER — Ambulatory Visit: Payer: 59 | Admitting: Podiatry

## 2022-05-26 ENCOUNTER — Encounter: Payer: Self-pay | Admitting: Podiatry

## 2022-05-26 ENCOUNTER — Ambulatory Visit (INDEPENDENT_AMBULATORY_CARE_PROVIDER_SITE_OTHER): Payer: 59

## 2022-05-26 DIAGNOSIS — M722 Plantar fascial fibromatosis: Secondary | ICD-10-CM

## 2022-05-26 DIAGNOSIS — L603 Nail dystrophy: Secondary | ICD-10-CM

## 2022-05-26 DIAGNOSIS — R7301 Impaired fasting glucose: Secondary | ICD-10-CM | POA: Insufficient documentation

## 2022-05-26 DIAGNOSIS — R001 Bradycardia, unspecified: Secondary | ICD-10-CM | POA: Insufficient documentation

## 2022-05-26 DIAGNOSIS — Z0000 Encounter for general adult medical examination without abnormal findings: Secondary | ICD-10-CM | POA: Insufficient documentation

## 2022-05-26 DIAGNOSIS — L989 Disorder of the skin and subcutaneous tissue, unspecified: Secondary | ICD-10-CM | POA: Insufficient documentation

## 2022-05-26 MED ORDER — TRIAMCINOLONE ACETONIDE 40 MG/ML IJ SUSP
20.0000 mg | Freq: Once | INTRAMUSCULAR | Status: AC
  Administered 2022-05-26: 20 mg

## 2022-05-26 MED ORDER — METHYLPREDNISOLONE 4 MG PO TBPK: ORAL_TABLET | ORAL | 0 refills | Status: DC

## 2022-05-26 MED ORDER — MELOXICAM 15 MG PO TABS: 15.0000 mg | ORAL_TABLET | Freq: Every day | ORAL | 3 refills | Status: DC

## 2022-05-26 NOTE — Progress Notes (Signed)
Subjective:  Patient ID: Brandon Short, male    DOB: 1960-04-25,  MRN: [id number] HPI Chief Complaint  Patient presents with   Foot Pain    Plantar heel right - aching x 6 months, AM pain, tried vibrating rolling device, night splint-some help, active runner, so keeps flaring up   Nail Problem    Toenails right - thick and dark   New Patient (Initial Visit)    62 y.o. male presents with the above complaint.   ROS: Denies fever chills nausea vomit muscle aches pains calf pain back pain chest pain shortness of breath.  Past Medical History:  Diagnosis Date   Bradycardia    Heart attack ([place]) 01/16/2021   After running a 13 mile marathon   Hyperlipidemia    Hypertension    Kidney stones    Past Surgical History:  Procedure Laterality Date   APPENDECTOMY  03/08/2003   COLONOSCOPY     KIDNEY STONE SURGERY  02/2021   MENISCUS REPAIR     SHOULDER SURGERY Bilateral 08/2021    Current Outpatient Medications:    meloxicam (MOBIC) 15 MG tablet, Take 1 tablet (15 mg total) by mouth daily., Disp: 30 tablet, Rfl: 3   methylPREDNISolone (MEDROL DOSEPAK) 4 MG TBPK tablet, 6 day dose pack - take as directed, Disp: 21 tablet, Rfl: 0   amLODipine (NORVASC) 5 MG tablet, TAKE 1 TABLET(5 MG) BY MOUTH DAILY, Disp: 90 tablet, Rfl: 3   aspirin 81 MG chewable tablet, CHEW AND SWALLOW 1 TABLET(81 MG) BY MOUTH DAILY, Disp: 30 tablet, Rfl: 3   atorvastatin (LIPITOR) 10 MG tablet, Take 1 tablet (10 mg total) by mouth daily., Disp: 90 tablet, Rfl: 3   Cholecalciferol (D3-1000) 25 MCG (1000 UT) capsule, Take 1,000 Units by mouth daily., Disp: , Rfl:    Evolocumab (REPATHA SURECLICK) [id number] MG/ML SOAJ, Inject 140 mg into the skin every 14 (fourteen) days., Disp: 6 mL, Rfl: 3   L-Lysine 500 MG TABS, Take 1 tablet by mouth daily., Disp: , Rfl:    losartan (COZAAR) 50 MG tablet, TAKE 1 TABLET(50 MG) BY MOUTH EVERY EVENING, Disp: 90 tablet, Rfl: 0   Magnesium 80 MG TABS, Take by mouth., Disp: , Rfl:     nitroGLYCERIN (NITROSTAT) 0.4 MG SL tablet, Place 1 tablet under the tongue as needed for chest pain., Disp: , Rfl:   No Known Allergies Review of Systems Objective:  There were no vitals filed for this visit.  General: Well developed, nourished, in no acute distress, alert and oriented x3   Dermatological: Skin is warm, dry and supple bilateral. Nails x 10 are well maintained; remaining integument appears unremarkable at this time. There are no open sores, no preulcerative lesions, no rash or signs of infection present.  Toenails #3 #4 #5 of the right foot demonstrates nail dystrophy thickened dystrophic brittle painful  Vascular: Dorsalis Pedis artery and Posterior Tibial artery pedal pulses are 2/4 bilateral with immedate capillary fill time. Pedal hair growth present. No varicosities and no lower extremity edema present bilateral.   Neruologic: Grossly intact via light touch bilateral. Vibratory intact via tuning fork bilateral. Protective threshold with Semmes Wienstein monofilament intact to all pedal sites bilateral. Patellar and Achilles deep tendon reflexes 2+ bilateral. No Babinski or clonus noted bilateral.   Musculoskeletal: No gross boney pedal deformities bilateral. No pain, crepitus, or limitation noted with foot and ankle range of motion bilateral. Muscular strength 5/5 in all groups tested bilateral.  Pain on palpation medial and central  calcaneal tubercles right foot.  No pain on the left foot.  No pain on medial lateral compression of the right calcaneus.  Gait: Unassisted, Nonantalgic.    Radiographs:  Radiographs taken today demonstrate an osseously mature individual with relatively normal cavus or rectus foot type.  I also see a thickening of the plantar fascia at the calcaneal insertion site.  This consistent with Planter fasciitis.  Assessment & Plan:   Assessment: Nail dystrophy the last 3 toes on the right foot.  Plan fasciitis right.  Plan: Discussed etiology  pathology conservative versus surgical therapies.  At this point injected the right heel today 20 mg Kenalog 5 mg Marcaine point maximal tenderness.  Tolerated procedure well without complications.  Pathology number instructions for stretching were given and started him on methylprednisolone and placed on plantar fascia brace.  He took samples of the toenails and sent this for pathologic evaluation.     Ilya Neely T. [place], [place]

## 2022-05-26 NOTE — Patient Instructions (Signed)

## 2022-06-23 ENCOUNTER — Encounter: Payer: Self-pay | Admitting: Podiatry

## 2022-06-23 ENCOUNTER — Ambulatory Visit: Payer: 59 | Admitting: Podiatry

## 2022-06-23 DIAGNOSIS — M722 Plantar fascial fibromatosis: Secondary | ICD-10-CM

## 2022-06-23 DIAGNOSIS — L603 Nail dystrophy: Secondary | ICD-10-CM

## 2022-06-23 MED ORDER — TERBINAFINE HCL 250 MG PO TABS: 250.0000 mg | ORAL_TABLET | Freq: Every day | ORAL | 0 refills | Status: DC

## 2022-06-23 NOTE — Progress Notes (Signed)
He presents today for follow-up of his nail pathology as well as for follow-up of his Planter fasciitis of the right heel states that he is just getting back from [place] and his heel started to act up he stated seems to be doing a little bit better as long as he takes his anti-inflammatory.  Objective: Vital signs stable alert oriented x 3 minimal reproducible pain.  Pathology results do demonstrate onychomycosis in particular Trichophyton rubrum as well as pathologic detachment of the nail.  Assessment: Onychomycosis with mild Planter fasciitis.  Plan: Discussed etiology pathology conservative surgical therapies offered him injection today he declined he will continue all other conservative therapies for the heel pain.  We are going to go ahead and get him started on Lamisil 200 mg tablets 1 p.o. daily we discussed potential allergies and [place]ix associated with this he understands and is amenable to it we will follow-up with me in 1 month for blood work.

## 2022-06-30 ENCOUNTER — Encounter: Payer: Self-pay | Admitting: Podiatry

## 2022-07-17 ENCOUNTER — Other Ambulatory Visit: Payer: Self-pay | Admitting: Cardiology

## 2022-07-25 ENCOUNTER — Telehealth: Payer: Self-pay | Admitting: Podiatry

## 2022-07-25 NOTE — Telephone Encounter (Signed)
Pt stated that he is out of his Rx for Lamisil, stated that he called in for a refill request but was told he had to come in for a f/u visit. He said that he was supposed to take the Rx for 120 days but Rx was written for 30 days. Please advise

## 2022-08-04 ENCOUNTER — Ambulatory Visit: Payer: 59 | Admitting: Podiatry

## 2022-08-06 ENCOUNTER — Other Ambulatory Visit: Payer: Self-pay | Admitting: Cardiology

## 2022-08-07 ENCOUNTER — Other Ambulatory Visit: Payer: Self-pay | Admitting: Cardiology

## 2022-08-17 ENCOUNTER — Other Ambulatory Visit: Payer: Self-pay | Admitting: Cardiology

## 2022-08-23 ENCOUNTER — Ambulatory Visit: Payer: 59 | Admitting: Podiatry

## 2022-08-23 DIAGNOSIS — Z79899 Other long term (current) drug therapy: Secondary | ICD-10-CM

## 2022-08-23 DIAGNOSIS — L603 Nail dystrophy: Secondary | ICD-10-CM

## 2022-08-23 MED ORDER — TERBINAFINE HCL 250 MG PO TABS: 250.0000 mg | ORAL_TABLET | Freq: Every day | ORAL | 0 refills | Status: DC

## 2022-08-23 NOTE — Progress Notes (Signed)
He presents today for his first visit after completing 30 tablets of Lamisil.  He denies fever chills nausea vomit muscle aches pains calf pain back pain chest pain shortness of breath.  States he is doing quite well and that time he has noticed that his skin has improved nearly 100% and his toenails are starting to look different.  Objective: Vital signs stable alert and oriented x 3.  Pulses are palpable.  No change in physical exam short of skin clearance.  Assessment: Long-term therapy with Lamisil.  Plan: Requested a comprehensive metabolic panel today started him back on Lamisil 250 mg tablets.  We dispensed prescription for 90 tablets.  I will follow-up with him in 4 months.  Questions or concerns he will notify us immediately.  Should his blood work come back abnormal we will notify him.

## 2022-08-24 ENCOUNTER — Telehealth: Payer: Self-pay | Admitting: *Deleted

## 2022-08-24 LAB — COMPREHENSIVE METABOLIC PANEL
AG Ratio: 2.3 (calc) (ref 1.0–2.5)
ALT: 19 U/L (ref 9–46)
AST: 19 U/L (ref 10–35)
Albumin: 4.3 g/dL (ref 3.6–5.1)
Alkaline phosphatase (APISO): 70 U/L (ref 35–144)
BUN: 24 mg/dL (ref 7–25)
CO2: 27 mmol/L (ref 20–32)
Calcium: 8.9 mg/dL (ref 8.6–10.3)
Chloride: 109 mmol/L (ref 98–110)
Creat: 1.12 mg/dL (ref 0.70–1.35)
Globulin: 1.9 g/dL (calc) (ref 1.9–3.7)
Glucose, Bld: 94 mg/dL (ref 65–139)
Potassium: 4.3 mmol/L (ref 3.5–5.3)
Sodium: 142 mmol/L (ref 135–146)
Total Bilirubin: 0.9 mg/dL (ref 0.2–1.2)
Total Protein: 6.2 g/dL (ref 6.1–8.1)

## 2022-08-24 NOTE — Telephone Encounter (Signed)
-----   Message from Elinor Parkinson, [place] sent at 08/24/2022 12:06 PM EDT ----- Bonita Quin may let him know that his blood test was great and he may continue taking his medication.

## 2022-09-06 ENCOUNTER — Ambulatory Visit: Payer: 59 | Admitting: Cardiology

## 2022-09-06 ENCOUNTER — Encounter: Payer: Self-pay | Admitting: Cardiology

## 2022-09-06 VITALS — BP 133/79 | HR 45 | Resp 16 | Ht 74.0 in | Wt 231.0 lb

## 2022-09-06 DIAGNOSIS — I25118 Atherosclerotic heart disease of native coronary artery with other forms of angina pectoris: Secondary | ICD-10-CM

## 2022-09-06 DIAGNOSIS — I10 Essential (primary) hypertension: Secondary | ICD-10-CM

## 2022-09-06 DIAGNOSIS — R0683 Snoring: Secondary | ICD-10-CM

## 2022-09-06 DIAGNOSIS — K219 Gastro-esophageal reflux disease without esophagitis: Secondary | ICD-10-CM

## 2022-09-06 DIAGNOSIS — E7800 Pure hypercholesterolemia, unspecified: Secondary | ICD-10-CM

## 2022-09-06 MED ORDER — OMEPRAZOLE 40 MG PO CPDR
40.0000 mg | DELAYED_RELEASE_CAPSULE | Freq: Every day | ORAL | 2 refills | Status: DC
Start: 2022-09-06 — End: 2023-01-05
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
## 2022-09-09 ENCOUNTER — Encounter: Payer: Self-pay | Admitting: Cardiology

## 2022-10-15 ENCOUNTER — Other Ambulatory Visit: Payer: Self-pay | Admitting: Cardiology

## 2022-11-23 ENCOUNTER — Other Ambulatory Visit: Payer: Self-pay | Admitting: Cardiology

## 2022-11-30 ENCOUNTER — Other Ambulatory Visit: Payer: Self-pay | Admitting: Cardiology

## 2022-11-30 DIAGNOSIS — I25118 Atherosclerotic heart disease of native coronary artery with other forms of angina pectoris: Secondary | ICD-10-CM

## 2022-11-30 DIAGNOSIS — E7800 Pure hypercholesterolemia, unspecified: Secondary | ICD-10-CM

## 2022-11-30 DIAGNOSIS — E7841 Elevated Lipoprotein(a): Secondary | ICD-10-CM

## 2022-12-14 ENCOUNTER — Other Ambulatory Visit (HOSPITAL_COMMUNITY): Payer: Self-pay

## 2022-12-15 ENCOUNTER — Other Ambulatory Visit (HOSPITAL_COMMUNITY): Payer: Self-pay

## 2022-12-24 ENCOUNTER — Other Ambulatory Visit: Payer: Self-pay | Admitting: Cardiology

## 2022-12-24 DIAGNOSIS — E7800 Pure hypercholesterolemia, unspecified: Secondary | ICD-10-CM

## 2022-12-24 DIAGNOSIS — I25118 Atherosclerotic heart disease of native coronary artery with other forms of angina pectoris: Secondary | ICD-10-CM

## 2022-12-28 ENCOUNTER — Other Ambulatory Visit (HOSPITAL_COMMUNITY): Payer: Self-pay

## 2022-12-29 ENCOUNTER — Other Ambulatory Visit (HOSPITAL_COMMUNITY): Payer: Self-pay

## 2023-01-05 ENCOUNTER — Encounter: Payer: Self-pay | Admitting: Podiatry

## 2023-01-05 ENCOUNTER — Ambulatory Visit: Payer: 59 | Admitting: Podiatry

## 2023-01-05 DIAGNOSIS — L603 Nail dystrophy: Secondary | ICD-10-CM | POA: Diagnosis not present

## 2023-01-05 MED ORDER — TERBINAFINE HCL 250 MG PO TABS: 250.0000 mg | ORAL_TABLET | Freq: Every day | ORAL | 0 refills | Status: AC

## 2023-01-07 NOTE — Progress Notes (Signed)
He presents today after having completed 120 days of Lamisil therapy.  Denies fever chills nausea vomit muscle aches pains calf pain back pain chest pain shortness of breath.  He denies any rashes or itching.  States that the toenails are looking much improved.  Objective: Vital signs are stable oriented x 3 toenails are approximately 75% grown out appear to be doing very nicely at this point.  Assessment: Slowly resolving onychomycosis 120 days worth of Lamisil and completed.  Plan: At this point we are going to start him on his first every other day dose of Lamisil.  We will dispense 30 Lamisil tablets.  He will take 1 tablet by mouth every other day and I will follow-up with him in 3 months.

## 2023-01-13 ENCOUNTER — Other Ambulatory Visit: Payer: Self-pay | Admitting: Cardiology

## 2023-01-13 DIAGNOSIS — I10 Essential (primary) hypertension: Secondary | ICD-10-CM

## 2023-02-14 ENCOUNTER — Other Ambulatory Visit: Payer: BC Managed Care – PPO

## 2023-02-14 ENCOUNTER — Ambulatory Visit (HOSPITAL_COMMUNITY): Payer: 59 | Attending: Cardiology

## 2023-02-14 DIAGNOSIS — I25118 Atherosclerotic heart disease of native coronary artery with other forms of angina pectoris: Secondary | ICD-10-CM | POA: Insufficient documentation

## 2023-02-14 DIAGNOSIS — I7121 Aneurysm of the ascending aorta, without rupture: Secondary | ICD-10-CM | POA: Insufficient documentation

## 2023-02-14 LAB — ECHOCARDIOGRAM COMPLETE
Area-P 1/2: 3.08 cm2
P 1/2 time: 1096 ms
S' Lateral: 3 cm

## 2023-02-22 ENCOUNTER — Encounter: Payer: Self-pay | Admitting: Cardiology

## 2023-02-22 ENCOUNTER — Ambulatory Visit: Payer: 59 | Attending: Cardiology | Admitting: Cardiology

## 2023-02-22 VITALS — BP 108/72 | HR 81 | Resp 16 | Ht 74.0 in | Wt 228.8 lb

## 2023-02-22 DIAGNOSIS — I7121 Aneurysm of the ascending aorta, without rupture: Secondary | ICD-10-CM | POA: Diagnosis not present

## 2023-02-22 DIAGNOSIS — I25118 Atherosclerotic heart disease of native coronary artery with other forms of angina pectoris: Secondary | ICD-10-CM | POA: Diagnosis not present

## 2023-02-22 DIAGNOSIS — I10 Essential (primary) hypertension: Secondary | ICD-10-CM | POA: Diagnosis not present

## 2023-02-22 DIAGNOSIS — E7800 Pure hypercholesterolemia, unspecified: Secondary | ICD-10-CM | POA: Diagnosis not present

## 2023-02-22 MED ORDER — LOSARTAN POTASSIUM 100 MG PO TABS: 100.0000 mg | ORAL_TABLET | Freq: Every evening | ORAL | 3 refills | Status: DC
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
## 2023-02-22 NOTE — Patient Instructions (Signed)
Medication Instructions:  Your physician recommends that you continue on your current medications as directed. Please refer to the Current Medication list given to you today.  Lab Work: none If you have labs (blood work) drawn today and your tests are completely normal, you will receive your results only by: MyChart Message (if you have MyChart) OR A paper copy in the mail If you have any lab test that is abnormal or we need to change your treatment, we will call you to review the results.   Testing/Procedures: Your physician has requested that you have an echocardiogram.To be done in one year. Echocardiography is a painless test that uses sound waves to create images of your heart. It provides your doctor with information about the size and shape of your heart and how well your heart's chambers and valves are working. This procedure takes approximately one hour. There are no restrictions for this procedure. Please do NOT wear cologne, perfume, aftershave, or lotions (deodorant is allowed). Please arrive 15 minutes prior to your appointment time.  Please note: We ask at that you not bring children with you during ultrasound (echo/ vascular) testing. Due to room size and safety concerns, children are not allowed in the ultrasound rooms during exams. Our front office staff cannot provide observation of children in our lobby area while testing is being conducted. An adult accompanying a patient to their appointment will only be allowed in the ultrasound room at the discretion of the ultrasound technician under special circumstances. We apologize for any inconvenience.    Follow-Up: At [facility], you and your health needs are our priority.  As part of our continuing mission to provide you with exceptional heart care, we have created designated Provider Care Teams.  These Care Teams include your primary Cardiologist (physician) and Advanced Practice Providers (APPs -  Physician Assistants  and Nurse Practitioners) who all work together to provide you with the care you need, when you need it.  We recommend signing up for the patient portal called "MyChart".  Sign up information is provided on this After Visit Summary.  MyChart is used to connect with patients for Virtual Visits (Telemedicine).  Patients are able to view lab/test results, encounter notes, upcoming appointments, etc.  Non-urgent messages can be sent to your provider as well.   To learn more about what you can do with MyChart, go to [email or web address].    Your next appointment:   12 month(s)  Provider:   Yates Decamp, MD     Other Instructions

## 2023-04-06 ENCOUNTER — Encounter: Payer: Self-pay | Admitting: Podiatry

## 2023-04-06 ENCOUNTER — Ambulatory Visit (INDEPENDENT_AMBULATORY_CARE_PROVIDER_SITE_OTHER): Payer: No Typology Code available for payment source | Admitting: Podiatry

## 2023-04-06 DIAGNOSIS — L603 Nail dystrophy: Secondary | ICD-10-CM | POA: Diagnosis not present

## 2023-04-06 NOTE — Progress Notes (Signed)
He presents today he is completed his first every other day dosing schedule at this point.  He states they look great feels great no problems taking the medication he thinks he is finished.  Objective: There is no erythema edema cellulitis drainage or odor nails have grown out 100% I see no residual onychomycosis at this point.  Assessment: Well-healing onychomycosis long-term therapy with Lamisil.  Plan: Follow-up with me on an as-needed basis discontinue use of medication.

## 2023-04-30 ENCOUNTER — Encounter (HOSPITAL_BASED_OUTPATIENT_CLINIC_OR_DEPARTMENT_OTHER): Payer: Self-pay

## 2023-04-30 ENCOUNTER — Emergency Department (HOSPITAL_BASED_OUTPATIENT_CLINIC_OR_DEPARTMENT_OTHER)
Admission: EM | Admit: 2023-04-30 | Discharge: 2023-05-01 | Disposition: A | Discharge status: Home / Self Care | Payer: No Typology Code available for payment source | Attending: Emergency Medicine | Admitting: Emergency Medicine

## 2023-04-30 ENCOUNTER — Other Ambulatory Visit: Payer: Self-pay

## 2023-04-30 ENCOUNTER — Encounter: Payer: Self-pay | Admitting: Cardiology

## 2023-04-30 DIAGNOSIS — R001 Bradycardia, unspecified: Secondary | ICD-10-CM | POA: Insufficient documentation

## 2023-04-30 DIAGNOSIS — Z79899 Other long term (current) drug therapy: Secondary | ICD-10-CM | POA: Diagnosis not present

## 2023-04-30 DIAGNOSIS — I252 Old myocardial infarction: Secondary | ICD-10-CM | POA: Diagnosis not present

## 2023-04-30 DIAGNOSIS — I2510 Atherosclerotic heart disease of native coronary artery without angina pectoris: Secondary | ICD-10-CM | POA: Diagnosis not present

## 2023-04-30 DIAGNOSIS — I10 Essential (primary) hypertension: Secondary | ICD-10-CM

## 2023-04-30 DIAGNOSIS — Z7982 Long term (current) use of aspirin: Secondary | ICD-10-CM | POA: Diagnosis not present

## 2023-04-30 DIAGNOSIS — I159 Secondary hypertension, unspecified: Secondary | ICD-10-CM | POA: Diagnosis not present

## 2023-04-30 LAB — CBC WITH DIFFERENTIAL/PLATELET
Abs Immature Granulocytes: 0.02 10*3/uL (ref 0.00–0.07)
Basophils Absolute: 0 10*3/uL (ref 0.0–0.1)
Basophils Relative: 0 %
Eosinophils Absolute: 0.1 10*3/uL (ref 0.0–0.5)
Eosinophils Relative: 2 %
HCT: 38.9 % — ABNORMAL LOW (ref 39.0–52.0)
Hemoglobin: 13.4 g/dL (ref 13.0–17.0)
Immature Granulocytes: 0 %
Lymphocytes Relative: 46 %
Lymphs Abs: 2.4 10*3/uL (ref 0.7–4.0)
MCH: 29.9 pg (ref 26.0–34.0)
MCHC: 34.4 g/dL (ref 30.0–36.0)
MCV: 86.8 fL (ref 80.0–100.0)
Monocytes Absolute: 0.6 10*3/uL (ref 0.1–1.0)
Monocytes Relative: 10 %
Neutro Abs: 2.2 10*3/uL (ref 1.7–7.7)
Neutrophils Relative %: 42 %
Platelets: 188 10*3/uL (ref 150–400)
RBC: 4.48 MIL/uL (ref 4.22–5.81)
RDW: 12.1 % (ref 11.5–15.5)
WBC: 5.4 10*3/uL (ref 4.0–10.5)
nRBC: 0 % (ref 0.0–0.2)

## 2023-04-30 LAB — COMPREHENSIVE METABOLIC PANEL
ALT: 17 U/L (ref 0–44)
AST: 24 U/L (ref 15–41)
Albumin: 3.8 g/dL (ref 3.5–5.0)
Alkaline Phosphatase: 58 U/L (ref 38–126)
Anion gap: 7 (ref 5–15)
BUN: 26 mg/dL — ABNORMAL HIGH (ref 8–23)
CO2: 26 mmol/L (ref 22–32)
Calcium: 9.2 mg/dL (ref 8.9–10.3)
Chloride: 111 mmol/L (ref 98–111)
Creatinine, Ser: 1.03 mg/dL (ref 0.61–1.24)
GFR, Estimated: >60 mL/min (ref >60)
Glucose, Bld: 115 mg/dL — ABNORMAL HIGH (ref 70–99)
Potassium: 3.5 mmol/L (ref 3.5–5.1)
Sodium: 144 mmol/L (ref 135–145)
Total Bilirubin: 0.5 mg/dL (ref 0.0–1.2)
Total Protein: 5.7 g/dL — ABNORMAL LOW (ref 6.5–8.1)

## 2023-04-30 LAB — TROPONIN I (HIGH SENSITIVITY): Troponin I (High Sensitivity): 27 ng/L — ABNORMAL HIGH (ref <18)

## 2023-04-30 NOTE — ED Triage Notes (Addendum)
 Patient arrives via POV with complaints of elevated blood pressure at home. Patients BP was in 200's systolic at home.   ---patient also reports bradycardia at baseline.

## 2023-04-30 NOTE — Discharge Instructions (Signed)
 You were seen in the Emergency Department for elevated blood pressure readings at home Your blood pressure ranged from the 170s to 190s here Your blood work did not show any evidence of organ damage Your EKG and cardiac enzymes were not consistent with heart attack It is important that you follow-up with your primary care doctor to discuss closer management of your blood pressure Continue taking all previously prescribed occasions You should begin taking your blood pressure readings at home as persistently elevated blood pressure can lead to things like stroke and another heart attack Return to the emergency department for chest pain trouble breathing or any other concerning symptoms

## 2023-04-30 NOTE — ED Provider Notes (Signed)
 [place] [facility] AT [facility] Provider Note   CSN: [id number] Arrival date & time: 04/30/23  1948     History  Chief Complaint  Patient presents with   Hypertension    Brandon Short is a 63 y.o. male.  With a history of chronic sinus bradycardia, NSTEMI status post DES, CAD, hypertension hyperlipidemia who presents to ED for hypertension.  Patient has a daughter who is currently pregnant who was checking her blood pressure earlier tonight.  He incidentally checked his blood pressure and noted to be repeatedly elevated.  Patient does not routinely check his blood pressure at home.  He has not had any symptoms to speak of aside from the elevated blood pressure readings.  He did run a half marathon yesterday.  The last time he ran a half marathon years ago he had an NSTEMI with subsequent cardiac catheterization and stent placement.  Denies chest pain shortness of breath nausea vomiting diaphoresis.  He takes 81 mg aspirin daily   Hypertension       Home Medications Prior to Admission medications   Medication Sig Start Date End Date Taking? Authorizing Provider  aspirin 81 MG chewable tablet CHEW AND SWALLOW 1 TABLET(81 MG) BY MOUTH DAILY 10/17/22   Yates Decamp, MD  atorvastatin (LIPITOR) 10 MG tablet TAKE 1 TABLET(10 MG) BY MOUTH DAILY 12/26/22   Yates Decamp, MD  Cholecalciferol (D3-1000) 25 MCG (1000 UT) capsule Take 1,000 Units by mouth daily.    [provider]  Evolocumab (REPATHA SURECLICK) 140 MG/ML SOAJ ADMINISTER 1 ML UNDER THE SKIN EVERY 14 DAYS 12/01/22   Yates Decamp, MD  L-Lysine 500 MG TABS Take 1 tablet by mouth daily. 09/27/19   [provider]  losartan (COZAAR) 100 MG tablet Take 1 tablet (100 mg total) by mouth every evening. 02/22/23   Yates Decamp, MD  Magnesium 80 MG TABS Take by mouth.    [provider]  terbinafine (LAMISIL) 250 MG tablet Take 1 tablet (250 mg total) by mouth daily. 01/05/23   Hyatt, Max T, DPM       Allergies    Patient has no known allergies.    Review of Systems   Review of Systems  Physical Exam Updated Vital Signs BP (!) 177/84   Pulse (!) 28   Temp 97.6 F (36.4 C) (Oral)   Resp 18   Ht 6\' 2"  (1.88 m)   Wt 99.8 kg   SpO2 97%   BMI 28.25 kg/m  Physical Exam Vitals and nursing note reviewed.  HENT:     Head: Normocephalic and atraumatic.  Eyes:     Pupils: Pupils are equal, round, and reactive to light.  Cardiovascular:     Rate and Rhythm: Regular rhythm. Bradycardia present.  Pulmonary:     Effort: Pulmonary effort is normal.     Breath sounds: Normal breath sounds.  Abdominal:     Palpations: Abdomen is soft.     Tenderness: There is no abdominal tenderness.  Musculoskeletal:     Right lower leg: No edema.     Left lower leg: No edema.  Skin:    General: Skin is warm and dry.  Neurological:     Mental Status: He is alert.  Psychiatric:        Mood and Affect: Mood normal.     ED Results / Procedures / Treatments   Labs (all labs ordered are listed, but only abnormal results are displayed) Labs Reviewed  COMPREHENSIVE METABOLIC PANEL -  Abnormal; Notable for the following components:      Result Value   Glucose, Bld 115 (*)    BUN 26 (*)    Total Protein 5.7 (*)    All other components within normal limits  CBC WITH DIFFERENTIAL/PLATELET - Abnormal; Notable for the following components:   HCT 38.9 (*)    All other components within normal limits  TROPONIN I (HIGH SENSITIVITY) - Abnormal; Notable for the following components:   Troponin I (High Sensitivity) 27 (*)    All other components within normal limits    EKG EKG Interpretation Date/Time:  Sunday April 30 2023 20:01:50 EST Ventricular Rate:  37 PR Interval:  282 QRS Duration:  100 QT Interval:  504 QTC Calculation: 395 R Axis:   -14  Text Interpretation: Marked sinus bradycardia with 1st degree A-V block Anteroseptal infarct , age undetermined Abnormal ECG No previous ECGs  available Confirmed by Estelle June [phone]) on 04/30/2023 9:03:29 PM  Radiology No results found.  Procedures Procedures    Medications Ordered in ED Medications - No data to display  ED Course/ Medical Decision Making/ A&P Clinical Course as of 04/30/23 2331  Sun Apr 30, 2023  2328 EKG consistent with sinus bradycardia no evidence of ischemic changes.  Metabolic panel shows normal renal function and no elevation in LFTs.  No significant abnormalities on CBC with differential.  Initial high sensitive troponin of 27.  We will obtain delta troponin here continue to monitor on telemetry.  He has remained hypertensive with current systolic blood pressure in the 170s.  Considering he was in the 200s at home earlier tonight no need to bring his blood pressure down anymore at this time especially because he is still asymptomatic.  Jackquline Bosch DO, am transitioning care of this patient to the oncoming provider pending delta troponin, reevaluation and disposition [MP]    Clinical Course User Index [MP] Royanne Foots, DO                                 Medical Decision Making 63 year old male with history as above presenting for asymptomatic hypertension.  Extensive cardiac history.  No current chest pain shortness of breath or other symptoms.  Systolic blood pressure of 200 prior to arrival.  Systolic down to 604V upon my initial assessment without intervention.  Reports compliance with his antihypertensive medication (losartan).  Suspect this is secondary hypertension.  Without any reported pain or neurologic symptoms, low suspicion for aortic dissection.  Also low suspicion for ACS here but he does have multiple risk factors and a history of coronary artery disease with prior NSTEMI and stent.  Will obtain laboratory workup to look for any evidence of endorgan damage including high sensitive troponin.  Regarding his heart rate, he states that he "lives in the 30s" and has his whole life.   Will obtain ECG to look for any evidence of heart block but suspect this is going to be sinus bradycardia  Amount and/or Complexity of Data Reviewed Labs: ordered.           Final Clinical Impression(s) / ED Diagnoses Final diagnoses:  Sinus bradycardia  History of non-ST elevation myocardial infarction (NSTEMI)  Secondary hypertension    Rx / DC Orders ED Discharge Orders     None         Royanne Foots, DO 04/30/23 2331

## 2023-05-01 ENCOUNTER — Telehealth: Payer: Self-pay | Admitting: Cardiology

## 2023-05-01 LAB — TROPONIN I (HIGH SENSITIVITY): Troponin I (High Sensitivity): 28 ng/L — ABNORMAL HIGH (ref <18)

## 2023-05-01 MED ORDER — AMLODIPINE BESYLATE 5 MG PO TABS: 5.0000 mg | ORAL_TABLET | Freq: Every day | ORAL | Status: DC

## 2023-05-01 NOTE — Telephone Encounter (Signed)
 Pt called in to inform Dr. Jacinto Halim that he was in the ED yesterday because his BP was high. Please advise.

## 2023-05-01 NOTE — Telephone Encounter (Signed)
 Patient is following up. Brandon Short would like to know if Dr. Jacinto Halim would have an opportunity to review ED notes before EOD if possible. Brandon Short says Brandon Short has concerns with BP. Please advise.

## 2023-05-01 NOTE — ED Provider Notes (Signed)
 Patient signed out and repeat troponin.  Repeat troponin flat.  Blood pressures are persistently mildly elevated but no signs or symptoms of hypertensive urgency or emergency.  Follow-up with primary physician for adjustments in medications.   Shon Baton, MD 05/01/23 [phone]

## 2023-05-01 NOTE — Telephone Encounter (Signed)
 Dr Jacinto Halim replied to patient's my chart message.  Patient viewed message

## 2023-05-11 MED ORDER — EPLERENONE 25 MG PO TABS: 25.0000 mg | ORAL_TABLET | ORAL | 2 refills | Status: DC

## 2023-05-11 NOTE — Telephone Encounter (Signed)
 ICD-10-CM   1. Primary hypertension  I10 eplerenone (INSPRA) 25 MG tablet    Basic Metabolic Panel (BMET)     Meds ordered this encounter  Medications   amLODipine (NORVASC) 5 MG tablet    Sig: Take 1 tablet (5 mg total) by mouth daily.   eplerenone (INSPRA) 25 MG tablet    Sig: Take 1 tablet (25 mg total) by mouth every morning.    Dispense:  30 tablet    Refill:  2   Orders Placed This Encounter  Procedures   Basic Metabolic Panel (BMET)

## 2023-05-15 ENCOUNTER — Other Ambulatory Visit: Payer: Self-pay | Admitting: Cardiology

## 2023-05-15 DIAGNOSIS — E7800 Pure hypercholesterolemia, unspecified: Secondary | ICD-10-CM

## 2023-05-15 DIAGNOSIS — I25118 Atherosclerotic heart disease of native coronary artery with other forms of angina pectoris: Secondary | ICD-10-CM

## 2023-05-15 DIAGNOSIS — E7841 Elevated Lipoprotein(a): Secondary | ICD-10-CM

## 2023-05-26 ENCOUNTER — Telehealth: Payer: Self-pay | Admitting: Cardiology

## 2023-05-26 DIAGNOSIS — I10 Essential (primary) hypertension: Secondary | ICD-10-CM

## 2023-05-26 DIAGNOSIS — I7121 Aneurysm of the ascending aorta, without rupture: Secondary | ICD-10-CM

## 2023-05-26 DIAGNOSIS — I25118 Atherosclerotic heart disease of native coronary artery with other forms of angina pectoris: Secondary | ICD-10-CM

## 2023-05-26 DIAGNOSIS — E7800 Pure hypercholesterolemia, unspecified: Secondary | ICD-10-CM

## 2023-05-26 MED ORDER — ASPIRIN 81 MG PO CHEW: 81.0000 mg | CHEWABLE_TABLET | Freq: Every day | ORAL | 0 refills | Status: DC

## 2023-05-26 MED ORDER — ATORVASTATIN CALCIUM 10 MG PO TABS: 10.0000 mg | ORAL_TABLET | Freq: Every day | ORAL | 0 refills | Status: DC

## 2023-05-26 MED ORDER — AMLODIPINE BESYLATE 5 MG PO TABS: 5.0000 mg | ORAL_TABLET | Freq: Every day | ORAL | 0 refills | Status: DC

## 2023-05-26 MED ORDER — EPLERENONE 25 MG PO TABS: 25.0000 mg | ORAL_TABLET | ORAL | 0 refills | Status: DC

## 2023-05-26 MED ORDER — LOSARTAN POTASSIUM 100 MG PO TABS: 100.0000 mg | ORAL_TABLET | Freq: Every evening | ORAL | 0 refills | Status: DC

## 2023-05-26 NOTE — Telephone Encounter (Signed)
 Pt's medications were sent to pt's pharmacy as requested. Confirmation received.

## 2023-05-26 NOTE — Telephone Encounter (Signed)
*  STAT* If patient is at the pharmacy, call can be transferred to refill team.   1. Which medications need to be refilled? (please list name of each medication and dose if known) amLODipine (NORVASC) 5 MG tablet  aspirin 81 MG chewable tablet   atorvastatin (LIPITOR) 10 MG tablet    eplerenone (INSPRA) 25 MG tablet    losartan (COZAAR) 100 MG tablet   2. Which pharmacy/location (including street and city if local pharmacy) is medication to be sent to?  [id number] - [place], [place] - [address]      3. Do they need a 30 day or 90 day supply? 5 day supply  Pt is out of medication, went out of town for his grandchild's birth and forgot his medications at home.

## 2023-06-08 ENCOUNTER — Other Ambulatory Visit: Payer: Self-pay

## 2023-06-08 MED ORDER — AMLODIPINE BESYLATE 5 MG PO TABS: 5.0000 mg | ORAL_TABLET | Freq: Every day | ORAL | 2 refills | Status: DC

## 2023-06-13 ENCOUNTER — Other Ambulatory Visit: Payer: Self-pay

## 2023-06-13 DIAGNOSIS — I7121 Aneurysm of the ascending aorta, without rupture: Secondary | ICD-10-CM

## 2023-06-13 DIAGNOSIS — I25118 Atherosclerotic heart disease of native coronary artery with other forms of angina pectoris: Secondary | ICD-10-CM

## 2023-06-13 DIAGNOSIS — I10 Essential (primary) hypertension: Secondary | ICD-10-CM

## 2023-06-13 DIAGNOSIS — E7800 Pure hypercholesterolemia, unspecified: Secondary | ICD-10-CM

## 2023-06-13 MED ORDER — ATORVASTATIN CALCIUM 10 MG PO TABS: 10.0000 mg | ORAL_TABLET | Freq: Every day | ORAL | 2 refills | Status: AC

## 2023-06-13 MED ORDER — AMLODIPINE BESYLATE 5 MG PO TABS: 5.0000 mg | ORAL_TABLET | Freq: Every day | ORAL | 2 refills | Status: AC

## 2023-06-13 MED ORDER — EPLERENONE 25 MG PO TABS: 25.0000 mg | ORAL_TABLET | ORAL | 2 refills | Status: AC

## 2023-06-13 MED ORDER — LOSARTAN POTASSIUM 100 MG PO TABS: 100.0000 mg | ORAL_TABLET | Freq: Every evening | ORAL | 2 refills | Status: AC
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
## 2023-09-02 ENCOUNTER — Other Ambulatory Visit: Payer: Self-pay | Admitting: Cardiology

## 2023-09-05 ENCOUNTER — Other Ambulatory Visit: Payer: Self-pay

## 2023-09-05 MED ORDER — ASPIRIN 81 MG PO CHEW: 81.0000 mg | CHEWABLE_TABLET | Freq: Every day | ORAL | 5 refills | Status: DC

## 2024-01-17 ENCOUNTER — Other Ambulatory Visit: Payer: Self-pay | Admitting: Internal Medicine

## 2024-01-17 DIAGNOSIS — I252 Old myocardial infarction: Secondary | ICD-10-CM

## 2024-01-18 ENCOUNTER — Other Ambulatory Visit

## 2024-01-19 ENCOUNTER — Ambulatory Visit
Admission: RE | Admit: 2024-01-19 | Discharge: 2024-01-19 | Disposition: A | Discharge status: Home / Self Care | Source: Ambulatory Visit | Attending: Internal Medicine | Admitting: Internal Medicine

## 2024-01-19 DIAGNOSIS — I252 Old myocardial infarction: Secondary | ICD-10-CM

## 2024-01-30 ENCOUNTER — Other Ambulatory Visit: Payer: Self-pay | Admitting: Cardiology

## 2024-01-30 DIAGNOSIS — I25118 Atherosclerotic heart disease of native coronary artery with other forms of angina pectoris: Secondary | ICD-10-CM

## 2024-01-30 DIAGNOSIS — E7800 Pure hypercholesterolemia, unspecified: Secondary | ICD-10-CM

## 2024-01-30 DIAGNOSIS — E7841 Elevated Lipoprotein(a): Secondary | ICD-10-CM

## 2024-02-20 ENCOUNTER — Ambulatory Visit: Payer: Self-pay | Admitting: Cardiology

## 2024-02-20 ENCOUNTER — Ambulatory Visit (HOSPITAL_COMMUNITY)
Admission: RE | Admit: 2024-02-20 | Discharge: 2024-02-20 | Disposition: A | Payer: 59 | Source: Ambulatory Visit | Attending: Internal Medicine | Admitting: Internal Medicine

## 2024-02-20 DIAGNOSIS — I7121 Aneurysm of the ascending aorta, without rupture: Secondary | ICD-10-CM

## 2024-02-20 DIAGNOSIS — I25118 Atherosclerotic heart disease of native coronary artery with other forms of angina pectoris: Secondary | ICD-10-CM

## 2024-02-20 LAB — ECHOCARDIOGRAM COMPLETE
Area-P 1/2: 3.63 cm2
P 1/2 time: 1123 ms
S' Lateral: 3.2 cm

## 2024-02-20 NOTE — Progress Notes (Signed)
 Ascending aortic aneurysm appears to be stable around 4.4 to 4.5 cm, previously by CT measuring 4.7 cm.  We could consider repeating CT for now.  Continue observation.

## 2024-03-05 ENCOUNTER — Encounter: Payer: Self-pay | Admitting: Cardiology

## 2024-03-11 ENCOUNTER — Other Ambulatory Visit: Payer: Self-pay | Admitting: Cardiology

## 2024-04-06 ENCOUNTER — Other Ambulatory Visit: Payer: Self-pay | Admitting: Cardiology

## 2024-04-06 DIAGNOSIS — I25118 Atherosclerotic heart disease of native coronary artery with other forms of angina pectoris: Secondary | ICD-10-CM

## 2024-04-06 DIAGNOSIS — E7800 Pure hypercholesterolemia, unspecified: Secondary | ICD-10-CM

## 2024-04-09 ENCOUNTER — Other Ambulatory Visit: Payer: Self-pay | Admitting: Cardiology

## 2024-04-11 NOTE — Telephone Encounter (Signed)
 Labs can be Completed at next O/V
